# Patient Record
Sex: Female | Born: 1995 | Race: Black or African American | Hispanic: No | Marital: Single
Health system: Midwestern US, Community
[De-identification: ages and names within clinical notes are randomized; demographics above are authoritative.]

## PROBLEM LIST (undated history)

## (undated) DIAGNOSIS — E079 Disorder of thyroid, unspecified: Secondary | ICD-10-CM

---

## 2015-01-16 ENCOUNTER — Other Ambulatory Visit (HOSPITAL_COMMUNITY)
Admission: RE | Admit: 2015-01-16 | Discharge: 2015-01-16 | Disposition: A | Discharge status: Home / Self Care | Payer: Medicaid Other | Source: Ambulatory Visit | Attending: Internal Medicine | Admitting: Internal Medicine

## 2015-01-16 DIAGNOSIS — E031 Congenital hypothyroidism without goiter: Secondary | ICD-10-CM | POA: Diagnosis present

## 2015-01-16 LAB — T4, FREE: FREE T4: 1.79 ng/dL — AB (ref 0.61–1.12)

## 2015-01-16 LAB — TSH: TSH: 0.023 u[IU]/mL — AB (ref 0.350–4.500)

## 2015-06-24 ENCOUNTER — Emergency Department (INDEPENDENT_AMBULATORY_CARE_PROVIDER_SITE_OTHER): Payer: Medicaid Other

## 2015-06-24 ENCOUNTER — Emergency Department (INDEPENDENT_AMBULATORY_CARE_PROVIDER_SITE_OTHER)
Admission: EM | Admit: 2015-06-24 | Discharge: 2015-06-24 | Disposition: A | Discharge status: Home / Self Care | Payer: Medicaid Other | Source: Home / Self Care | Attending: Family Medicine | Admitting: Family Medicine

## 2015-06-24 ENCOUNTER — Encounter (HOSPITAL_COMMUNITY): Payer: Self-pay | Admitting: *Deleted

## 2015-06-24 DIAGNOSIS — S63502A Unspecified sprain of left wrist, initial encounter: Secondary | ICD-10-CM | POA: Diagnosis not present

## 2015-06-24 HISTORY — DX: Disorder of thyroid, unspecified: E07.9

## 2015-06-24 MED ORDER — MELOXICAM 7.5 MG PO TABS: 7.5000 mg | ORAL_TABLET | Freq: Two times a day (BID) | ORAL | Status: DC

## 2015-06-24 NOTE — Discharge Instructions (Signed)
Use ice , splint and meds as needed for soreness, see orthopedist if further problems.

## 2015-06-24 NOTE — ED Notes (Signed)
Pt  Reports   Injured  Her  l  Wrist  Last  Week  When  She  Jammed  Her  l  Arm on 1  Door     She  Has  Pain and  Swelling  l  foreram  Area

## 2015-06-24 NOTE — ED Provider Notes (Signed)
CSN: [id number]     Arrival date & time 06/24/15  1333 History   First MD Initiated Contact with Patient 06/24/15 1434     Chief Complaint  Patient presents with  . Arm Injury   (Consider location/radiation/quality/duration/timing/severity/associated sxs/prior Treatment) Patient is a 19 y.o. female presenting with arm injury. The history is provided by the patient.  Arm Injury Location:  Arm Arm location:  L forearm Pain details:    Quality:  Sharp   Severity:  Mild   Onset quality:  Gradual   Duration:  12 days   Progression:  Unchanged Chronicity:  New Dislocation: no   Foreign body present:  No foreign bodies Prior injury to area:  No Associated symptoms: decreased range of motion   Associated symptoms: no fever, no numbness, no stiffness and no swelling     Past Medical History  Diagnosis Date  . Thyroid disease    History reviewed. No pertinent past surgical history. History reviewed. No pertinent family history. Social History  Substance Use Topics  . Smoking status: None  . Smokeless tobacco: Never Used  . Alcohol Use: No   OB History    No data available     Review of Systems  Constitutional: Negative.  Negative for fever.  Musculoskeletal: Negative.  Negative for joint swelling and stiffness.  Skin: Negative.   All other systems reviewed and are negative.   Allergies  Review of patient's allergies indicates no known allergies.  Home Medications   Prior to Admission medications   Medication Sig Start Date End Date Taking? Authorizing Provider  ALBUTEROL IN Inhale into the lungs.   Yes Historical Provider, MD  Levothyroxine Sodium (LEVOTHROID PO) Take by mouth.   Yes Historical Provider, MD  Loratadine (CLARITIN PO) Take by mouth.   Yes Historical Provider, MD  meloxicam (MOBIC) 7.5 MG tablet Take 1 tablet (7.5 mg total) by mouth 2 (two) times daily after a meal. 06/24/15   Linna Hoff, MD   Meds Ordered and Administered this Visit  Medications -  No data to display  BP 125/73 mmHg  Pulse 62  Temp(Src) 97.9 F (36.6 C) (Oral)  SpO2 98%  LMP 05/29/2015 No data found.   Physical Exam  Constitutional: She is oriented to person, place, and time. She appears well-developed and well-nourished. No distress.  Musculoskeletal: Normal range of motion. She exhibits tenderness.       Left elbow: She exhibits normal range of motion, no swelling, no effusion and no deformity. Tenderness found.       Left wrist: She exhibits tenderness. She exhibits normal range of motion, no bony tenderness, no swelling, no effusion and no deformity.  Neurological: She is alert and oriented to person, place, and time.  Skin: Skin is warm and dry.  Nursing note and vitals reviewed.   ED Course  Procedures (including critical care time)  Labs Review Labs Reviewed - No data to display  Imaging Review Dg Forearm Left  06/24/2015  CLINICAL DATA:  Injury 12 days ago EXAM: LEFT FOREARM - 2 VIEW COMPARISON:  None. FINDINGS: There is no evidence of fracture or other focal bone lesions. Soft tissues are unremarkable. IMPRESSION: Negative. Electronically Signed   By: Jolaine Click M.D.   On: 06/24/2015 14:50   X-rays reviewed and report per radiologist.   Visual Acuity Review  Right Eye Distance:   Left Eye Distance:   Bilateral Distance:    Right Eye Near:   Left Eye Near:  Bilateral Near:         MDM   1. Sprain of wrist, left, initial encounter        Linna HoffJames D Kindl, MD 06/24/15 1544

## 2015-12-29 ENCOUNTER — Ambulatory Visit (HOSPITAL_COMMUNITY)
Admission: EM | Admit: 2015-12-29 | Discharge: 2015-12-29 | Disposition: A | Discharge status: Home / Self Care | Payer: Medicaid Other | Attending: Family Medicine | Admitting: Family Medicine

## 2015-12-29 ENCOUNTER — Encounter (HOSPITAL_COMMUNITY): Payer: Self-pay | Admitting: Emergency Medicine

## 2015-12-29 DIAGNOSIS — M67432 Ganglion, left wrist: Secondary | ICD-10-CM

## 2015-12-29 DIAGNOSIS — L089 Local infection of the skin and subcutaneous tissue, unspecified: Secondary | ICD-10-CM

## 2015-12-29 DIAGNOSIS — L723 Sebaceous cyst: Secondary | ICD-10-CM | POA: Diagnosis not present

## 2015-12-29 MED ORDER — CEPHALEXIN 500 MG PO CAPS: 500.0000 mg | ORAL_CAPSULE | Freq: Two times a day (BID) | ORAL | Status: DC

## 2015-12-29 NOTE — ED Provider Notes (Signed)
CSN: [id number]     Arrival date & time 12/29/15  1302 History   First MD Initiated Contact with Patient 12/29/15 1324     Chief Complaint  Patient presents with  . Abscess  . Wrist Injury   (Consider location/radiation/quality/duration/timing/severity/associated sxs/prior Treatment) HPI Comments: Patient is a 20 yo that presents with 2 issues today. 1. She is having some continued pain in the left wrist after hitting it on a wall 2 days ago. She notes a "knot" in the wrist. No overall joint swelling, erythema or warmth.  2. Recurrent "boils" on her buttocks. No fever or chills. One of these areas is causing some pain. No drainage.   Patient is a 20 y.o. female presenting with abscess and wrist injury. The history is provided by the patient.  Abscess Associated symptoms: no fever   Wrist Injury Associated symptoms: no fever     Past Medical History  Diagnosis Date  . Thyroid disease    History reviewed. No pertinent past surgical history. Family History  Problem Relation Age of Onset  . Cancer Mother    Social History  Substance Use Topics  . Smoking status: Never Smoker   . Smokeless tobacco: Never Used  . Alcohol Use: No   OB History    No data available     Review of Systems  Constitutional: Negative for fever.  Musculoskeletal: Negative for joint swelling and arthralgias.  Skin: Negative for color change and rash.    Allergies  Review of patient's allergies indicates no known allergies.  Home Medications   Prior to Admission medications   Medication Sig Start Date End Date Taking? Authorizing Provider  Levothyroxine Sodium (LEVOTHROID PO) Take by mouth.   Yes Historical Provider, MD  ALBUTEROL IN Inhale into the lungs.    Historical Provider, MD  cephALEXin (KEFLEX) 500 MG capsule Take 1 capsule (500 mg total) by mouth 2 (two) times daily. 12/29/15   Riki Sheer, PA-C  Loratadine (CLARITIN PO) Take by mouth.    Historical Provider, MD  meloxicam (MOBIC)  7.5 MG tablet Take 1 tablet (7.5 mg total) by mouth 2 (two) times daily after a meal. 06/24/15   Linna Hoff, MD   Meds Ordered and Administered this Visit  Medications - No data to display  BP 140/80 mmHg  Pulse 63  Temp(Src) 98.7 F (37.1 C) (Oral)  Resp 12  SpO2 100%  LMP 11/29/2015 (Exact Date) No data found.   Physical Exam  Constitutional: She is oriented to person, place, and time. She appears well-developed and well-nourished. No distress.  Musculoskeletal: Normal range of motion. She exhibits tenderness. She exhibits no edema.  Left wrist without effusion, erythema or warmth. Small probable ganglion cyst that is tender to palpation.   Neurological: She is alert and oriented to person, place, and time.  Skin: Skin is warm and dry. She is not diaphoretic. No erythema.  Small sores to both buttocks totaling 3. No fluctuant areas. Tender to palpation  Psychiatric: Her behavior is normal.  Nursing note and vitals reviewed.   ED Course  Procedures (including critical care time)  Labs Review Labs Reviewed - No data to display  Imaging Review No results found.   Visual Acuity Review  Right Eye Distance:   Left Eye Distance:   Bilateral Distance:    Right Eye Near:   Left Eye Near:    Bilateral Near:         MDM   1. Ganglion cyst of wrist,  left   2. Infected sebaceous cyst    1. Suggest use of brace (she has) for days to a week to rest. Fluid generally will dissipate. If continues to cause irritation f/u with Ortho. No emergent needs. Advil or Aleve may be helpful as well.  2. Non-fluctuant so an I&D is not warranted. Cover with Keflex and suggest use of Hibiclens baths x 2 weeks, avoiding genitalia. If worsens infection then f/u.      Riki SheerMichelle G Young, PA-C 12/29/15 1428

## 2015-12-29 NOTE — ED Notes (Signed)
Pt is following up on a wrist injury she was seen here for a few weeks ago.  She has hit the wrist again and it the bump on it has gotten bigger and the pain has gotten worse.  Pt also reports recurrent abscesses on her left buttocks that open up and drain, but keep coming back.

## 2015-12-29 NOTE — Discharge Instructions (Signed)
1. You have a ganglion cyst in the left wrist. This will get inflammed at times with trauma. Wear your wrist splint to prevent movement for a few days and this will help. Advil or Aleve will help with the inflammation. If it worsens or becomes larger then f/u with [facility] (number given).  2. You have sebaceous cyst that are inflamed and or infected. The antibiotic will help. None that needs opening on exam today. Discussed use of Hibiclens to these areas will help reoccurrence. Nice to meet you. Feel better.    Sebaceous Cyst Removal Sebaceous cyst removal is a procedure to remove a sac of oily material that forms under your skin (sebaceous cyst). Sebaceous cysts may also be called epidermoid cysts or keratin cysts. Normally, the skin secretes this oily material through a gland or a hair follicle. This type of cyst usually results when a skin gland or hair follicle becomes blocked. You may need this procedure if you have a sebaceous cyst that becomes large, uncomfortable, or infected. LET [facility] CARE PROVIDER KNOW ABOUT:  Any allergies you have.  All medicines you are taking, including vitamins, herbs, eye drops, creams, and over-the-counter medicines.  Previous problems you or members of your family have had with the use of anesthetics.  Any blood disorders you have.  Previous surgeries you have had.  Medical conditions you have. RISKS AND COMPLICATIONS Generally, this is a safe procedure. However, problems may occur, including:  Developing another cyst.  Bleeding.  Infection.  Scarring. BEFORE THE PROCEDURE  Ask your health care provider about:  Changing or stopping your regular medicines. This is especially important if you are taking diabetes medicines or blood thinners.  Taking medicines such as aspirin and ibuprofen. These medicines can thin your blood. Do not take these medicines before your procedure if your health care provider instructs you not to.  If you  have an infected cyst, you may have to take antibiotic medicines before or after the cyst removal. Take your antibiotics as directed by your health care provider. Finish all of the medicine even if you start to feel better.  Take a shower on the morning of your procedure. Your health care provider may ask you to use a germ-killing (antiseptic) soap. PROCEDURE  You will be given a medicine that numbs the area (local anesthetic).  The skin around the cyst will be cleaned with a germ-killing solution (antiseptic).  Your health care provider will make a small surgical incision over the cyst.  The cyst will be separated from the surrounding tissues that are under your skin.  If possible, the cyst will be removed undamaged (intact).  If the cyst bursts (ruptures), it will need to be removed in pieces.  After the cyst is removed, your health care provider will control any bleeding and close the incision with small stitches (sutures). Small incisions may not need sutures, and the bleeding will be controlled by applying direct pressure with gauze.  Your health care provider may apply antibiotic ointment and a light bandage (dressing) over the incision. This procedure may vary among health care providers and hospitals. AFTER THE PROCEDURE  If your cyst ruptured during surgery, you may need to take antibiotic medicine. If you were prescribed an antibiotic medicine, finish all of it even if you start to feel better.   This information is not intended to replace advice given to you by your health care provider. Make sure you discuss any questions you have with your health care  provider.   Document Released: 07/05/2000 Document Revised: 07/29/2014 Document Reviewed: 03/23/2014 Elsevier Interactive Patient Education 2016 Else[employer] Inc[employer]  Ganglion Cyst A ganglion cyst is a noncancerous, fluid-filled lump that occurs near joints or tendons. The ganglion cyst grows out of a joint or the lining of a  tendon. It most often develops in the hand or wrist, but it can also develop in the shoulder, elbow, hip, knee, ankle, or foot. The round or oval ganglion cyst can be the size of a pea or larger than a grape. Increased activity may enlarge the size of the cyst because more fluid starts to build up.  CAUSES It is not known what causes a ganglion cyst to grow. However, it may be related to:  Inflammation or irritation around the joint.  An injury.  Repetitive movements or overuse.  Arthritis. RISK FACTORS Risk factors include:  Being a woman.  Being age 20-50. SIGNS AND SYMPTOMS Symptoms may include:   A lump. This most often appears on the hand or wrist, but it can occur in other areas of the body.  Tingling.  Pain.  Numbness.  Muscle weakness.  Weak grip.  Less movement in a joint. DIAGNOSIS Ganglion cysts are most often diagnosed based on a physical exam. Your health care provider will feel the lump and may shine a light alongside it. If it is a ganglion cyst, a light often shines through it. Your health care provider may order an X-ray, ultrasound, or MRI to rule out other conditions. TREATMENT Ganglion cysts usually go away on their own without treatment. If pain or other symptoms are involved, treatment may be needed. Treatment is also needed if the ganglion cyst limits your movement or if it gets infected. Treatment may include:  Wearing a brace or splint on your wrist or finger.  Taking anti-inflammatory medicine.  Draining fluid from the lump with a needle (aspiration).  Injecting a steroid into the joint.  Surgery to remove the ganglion cyst. HOME CARE INSTRUCTIONS  Do not press on the ganglion cyst, poke it with a needle, or hit it.  Take medicines only as directed by your health care provider.  Wear your brace or splint as directed by your health care provider.  Watch your ganglion cyst for any changes.  Keep all follow-up visits as directed by your  health care provider. This is important. SEEK MEDICAL CARE IF:  Your ganglion cyst becomes larger or more painful.  You have increased redness, red streaks, or swelling.  You have pus coming from the lump.  You have weakness or numbness in the affected area.  You have a fever or chills.   This information is not intended to replace advice given to you by your health care provider. Make sure you discuss any questions you have with your health care provider.   Document Released: 07/05/2000 Document Revised: 07/29/2014 Document Reviewed: 12/21/2013 Elsevier Interactive Patient Education [employer] Elsevier Inc.

## 2016-05-14 IMAGING — DX DG FOREARM 2V*L*
2 series · 2 of 2 positions shown · non-contrast
Comparison: None.

CLINICAL DATA: Injury 12 days ago

EXAM:
LEFT FOREARM - 2 VIEW

[forearm ap]
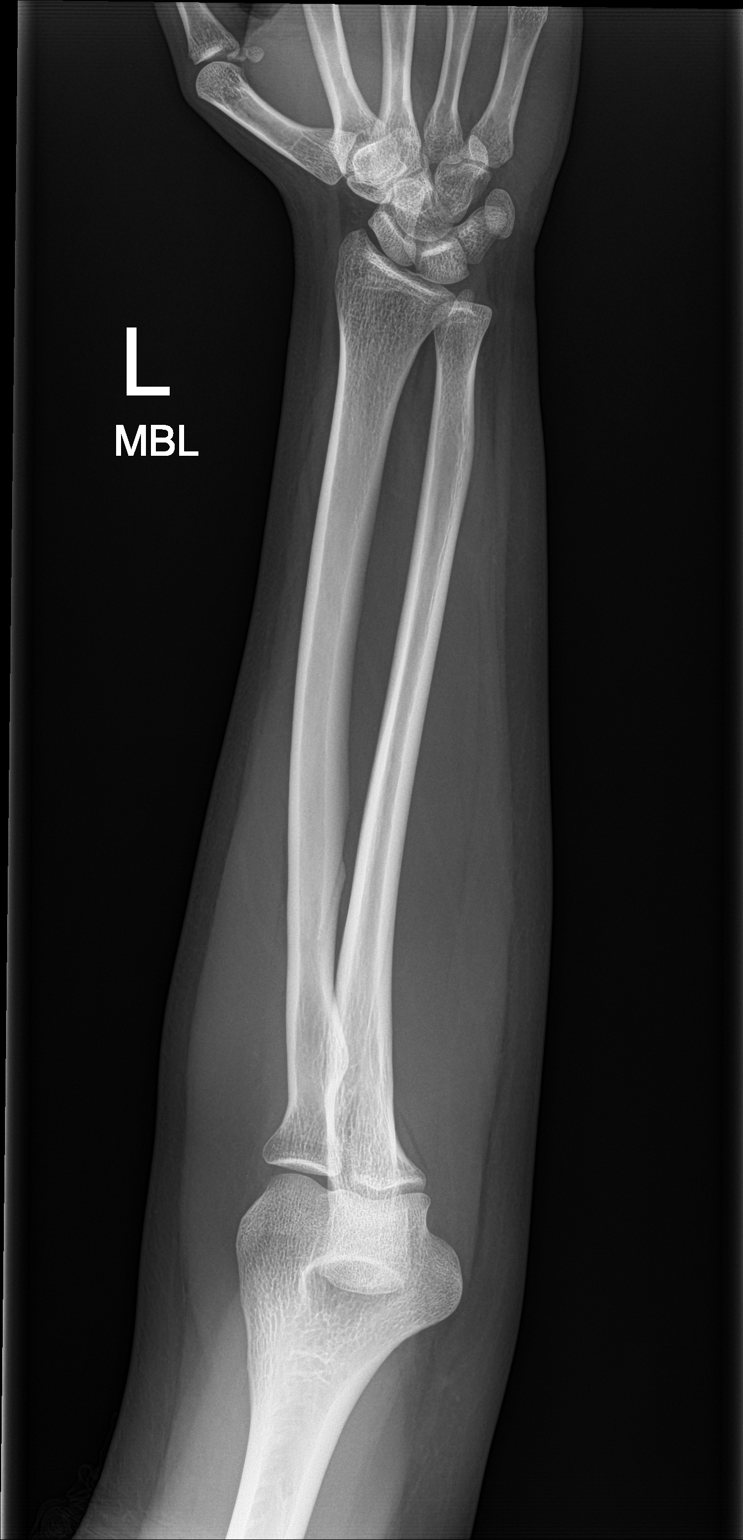

[forearm lat]
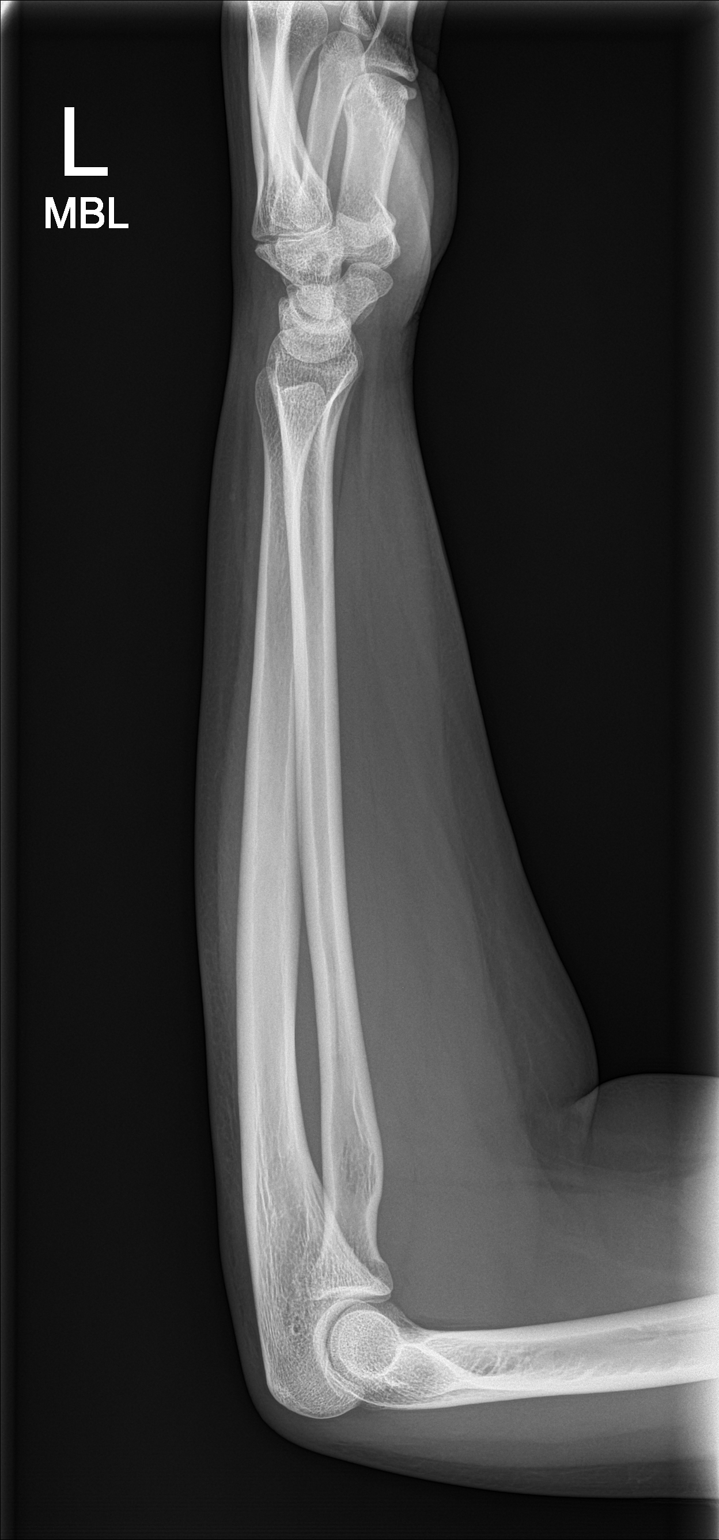

[2 of 2 positions shown; findings below may reference images not displayed]

FINDINGS: There is no evidence of fracture or other focal bone lesions. Soft
tissues are unremarkable.
IMPRESSION: Negative.

## 2016-10-21 ENCOUNTER — Other Ambulatory Visit (HOSPITAL_COMMUNITY): Payer: Self-pay | Admitting: Family Medicine

## 2016-10-21 DIAGNOSIS — E000 Congenital iodine-deficiency syndrome, neurological type: Secondary | ICD-10-CM

## 2016-11-04 ENCOUNTER — Ambulatory Visit (HOSPITAL_COMMUNITY): Payer: Medicaid Other

## 2016-11-05 ENCOUNTER — Encounter (HOSPITAL_COMMUNITY): Payer: Medicaid Other

## 2016-11-09 ENCOUNTER — Encounter (HOSPITAL_COMMUNITY): Payer: Self-pay | Admitting: Emergency Medicine

## 2016-11-09 ENCOUNTER — Ambulatory Visit (HOSPITAL_COMMUNITY)
Admission: EM | Admit: 2016-11-09 | Discharge: 2016-11-09 | Disposition: A | Discharge status: Home / Self Care | Payer: Medicaid Other | Attending: Family Medicine | Admitting: Family Medicine

## 2016-11-09 DIAGNOSIS — R51 Headache: Secondary | ICD-10-CM

## 2016-11-09 DIAGNOSIS — M5489 Other dorsalgia: Secondary | ICD-10-CM

## 2016-11-09 DIAGNOSIS — M542 Cervicalgia: Secondary | ICD-10-CM | POA: Diagnosis not present

## 2016-11-09 MED ORDER — CYCLOBENZAPRINE HCL 10 MG PO TABS: 10.0000 mg | ORAL_TABLET | Freq: Two times a day (BID) | ORAL | 0 refills | Status: AC | PRN

## 2016-11-09 MED ORDER — IBUPROFEN 800 MG PO TABS: 800.0000 mg | ORAL_TABLET | Freq: Three times a day (TID) | ORAL | 0 refills | Status: AC | PRN

## 2016-11-09 NOTE — Discharge Instructions (Signed)
Ibuprofen and Muscle relaxer send in to your pharmacy. Take them as needed. Apply heat to the area. Massage the area.  Return as needed. Follow up with your primary care doctor for no improvement.
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
## 2016-11-12 ENCOUNTER — Encounter (HOSPITAL_COMMUNITY)
Admission: RE | Admit: 2016-11-12 | Discharge: 2016-11-12 | Disposition: A | Discharge status: Home / Self Care | Payer: Medicaid Other | Source: Ambulatory Visit | Attending: Family Medicine | Admitting: Family Medicine

## 2016-11-12 ENCOUNTER — Encounter (HOSPITAL_COMMUNITY): Payer: Self-pay

## 2016-11-12 DIAGNOSIS — E000 Congenital iodine-deficiency syndrome, neurological type: Secondary | ICD-10-CM

## 2016-11-13 ENCOUNTER — Encounter (HOSPITAL_COMMUNITY): Payer: Medicaid Other

## 2016-12-05 ENCOUNTER — Encounter (HOSPITAL_COMMUNITY): Payer: Self-pay | Admitting: *Deleted

## 2016-12-05 ENCOUNTER — Ambulatory Visit (HOSPITAL_COMMUNITY)
Admission: EM | Admit: 2016-12-05 | Discharge: 2016-12-05 | Disposition: A | Discharge status: Home / Self Care | Payer: Medicaid Other | Attending: Internal Medicine | Admitting: Internal Medicine

## 2016-12-05 DIAGNOSIS — R350 Frequency of micturition: Secondary | ICD-10-CM | POA: Insufficient documentation

## 2016-12-05 DIAGNOSIS — R300 Dysuria: Secondary | ICD-10-CM

## 2016-12-05 DIAGNOSIS — Z79899 Other long term (current) drug therapy: Secondary | ICD-10-CM | POA: Diagnosis not present

## 2016-12-05 DIAGNOSIS — E079 Disorder of thyroid, unspecified: Secondary | ICD-10-CM | POA: Insufficient documentation

## 2016-12-05 DIAGNOSIS — Z3202 Encounter for pregnancy test, result negative: Secondary | ICD-10-CM | POA: Diagnosis not present

## 2016-12-05 DIAGNOSIS — S5011XA Contusion of right forearm, initial encounter: Secondary | ICD-10-CM

## 2016-12-05 DIAGNOSIS — K5900 Constipation, unspecified: Secondary | ICD-10-CM | POA: Diagnosis not present

## 2016-12-05 LAB — POCT URINALYSIS DIP (DEVICE)
Bilirubin Urine: NEGATIVE
Glucose, UA: NEGATIVE mg/dL
HGB URINE DIPSTICK: NEGATIVE
Ketones, ur: NEGATIVE mg/dL
NITRITE: NEGATIVE
Protein, ur: 30 mg/dL — AB
Specific Gravity, Urine: 1.02 (ref 1.005–1.030)
Urobilinogen, UA: 1 mg/dL (ref 0.0–1.0)
pH: 7.5 (ref 5.0–8.0)

## 2016-12-05 LAB — POCT PREGNANCY, URINE: PREG TEST UR: NEGATIVE

## 2016-12-05 MED ORDER — AZITHROMYCIN 250 MG PO TABS
1000.0000 mg | ORAL_TABLET | Freq: Once | ORAL | Status: AC
  Administered 2016-12-05: 1000 mg via ORAL

## 2016-12-05 MED ORDER — CEFTRIAXONE SODIUM 250 MG IJ SOLR
INTRAMUSCULAR | Status: AC
  Filled 2016-12-05: qty 250

## 2016-12-05 MED ORDER — AZITHROMYCIN 250 MG PO TABS
ORAL_TABLET | ORAL | Status: AC
  Filled 2016-12-05: qty 4

## 2016-12-05 MED ORDER — CEFTRIAXONE SODIUM 250 MG IJ SOLR
250.0000 mg | Freq: Once | INTRAMUSCULAR | Status: AC
  Administered 2016-12-05: 250 mg via INTRAMUSCULAR

## 2016-12-05 MED ORDER — POLYETHYLENE GLYCOL 3350 17 GM/SCOOP PO POWD: 17.0000 g | Freq: Every day | ORAL | 0 refills | Status: DC

## 2016-12-05 MED ORDER — STERILE WATER FOR INJECTION IJ SOLN
INTRAMUSCULAR | Status: AC
  Filled 2016-12-05: qty 10

## 2016-12-05 NOTE — ED Provider Notes (Signed)
[facility]    CSN: [id number] Arrival date & time: 12/05/16  1248     History   Chief Complaint Chief Complaint  Patient presents with  . Urinary Tract Infection    HPI Danielle Rosales is a 21 y.o. female. She presents today with several days history of urinary frequency and small volumes, dysuria. No pelvic discomfort. This had some decreased frequency of bowel movements, smaller size. No unusual vaginal discharge. Had her menstrual cycle last week. Has a little bit of itching on her bottom when she has a bowel movement, wondered about a hemorrhoid. Not painful. No fever, no malaise. Was wrestling with her companion last night, and struck her right ulnar forearm on something, no sore. Able to move the wrist and the elbow without difficulty.    HPI  Past Medical History:  Diagnosis Date  . Thyroid disease    History reviewed. No pertinent surgical history.   Home Medications    Prior to Admission medications   Medication Sig Start Date End Date Taking? Authorizing Provider  ALBUTEROL IN Inhale into the lungs.    [provider]  cephALEXin (KEFLEX) 500 MG capsule Take 1 capsule (500 mg total) by mouth 2 (two) times daily. 12/07/16 12/12/16  Eustace MooreMurray, Makynna Manocchio W, MD  Levothyroxine Sodium (LEVOTHROID PO) Take by mouth.    [provider]  Loratadine (CLARITIN PO) Take by mouth.    [provider]  meloxicam (MOBIC) 7.5 MG tablet Take 1 tablet (7.5 mg total) by mouth 2 (two) times daily after a meal. 06/24/15   Kindl, Quita SkyeJames D, MD  polyethylene glycol powder (GLYCOLAX/MIRALAX) powder Take 17 g by mouth daily. 12/05/16   Eustace MooreMurray, Millisa Giarrusso W, MD  temazepam (RESTORIL) 7.5 MG capsule Take 7.5 mg by mouth at bedtime as needed for sleep.    [provider]  venlafaxine (EFFEXOR) 75 MG tablet Take 75 mg by mouth 2 (two) times daily.    [provider]    Family History Family History  Problem Relation Age of Onset  . Cancer Mother      Social History Social History  Substance Use Topics  . Smoking status: Never Smoker  . Smokeless tobacco: Never Used  . Alcohol use No     Allergies   Patient has no known allergies.   Review of Systems Review of Systems  All other systems reviewed and are negative.    Physical Exam Triage Vital Signs ED Triage Vitals  Enc Vitals Group     BP 12/05/16 1305 (!) 110/59     Pulse Rate 12/05/16 1305 78     Resp 12/05/16 1305 18     Temp 12/05/16 1305 98.5 F (36.9 C)     Temp Source 12/05/16 1305 Oral     SpO2 12/05/16 1305 100 %     Weight --      Height --      Pain Score 12/05/16 1303 7     Pain Loc --    Updated Vital Signs BP (!) 110/59 (BP Location: Right Arm)   Pulse 78   Temp 98.5 F (36.9 C) (Oral)   Resp 18   LMP 11/29/2016   SpO2 100%   Physical Exam  Constitutional: She is oriented to person, place, and time. No distress.  HENT:  Head: Atraumatic.  Eyes:  Conjugate gaze observed, no eye redness/discharge  Neck: Neck supple.  Cardiovascular: Normal rate.   Pulmonary/Chest: No respiratory distress.  Abdominal: Soft. She exhibits no distension.  There is no tenderness. There is no rebound and no guarding.  Genitourinary:  Genitourinary Comments: No perineal rash Tiny pile at 6:00 position of anus, noninflamed, non tender  Musculoskeletal: Normal range of motion.  Good wrist and elbow range of motion on the right, focal area of mild tenderness and slight swelling, bruising, just distal to the midpoint of the ulnar forearm.  Neurological: She is alert and oriented to person, place, and time.  Skin: Skin is warm and dry.  Nursing note and vitals reviewed.    UC Treatments / Results  Labs Results for orders placed or performed during the hospital encounter of 12/05/16  Urine culture  Result Value Ref Range   Specimen Description URINE, CLEAN CATCH    Special Requests NONE    Culture >=100,000 COLONIES/mL VIRIDANS STREPTOCOCCUS (A)     Report Status 12/07/2016 FINAL   POCT urinalysis dip (device)  Result Value Ref Range   Glucose, UA NEGATIVE NEGATIVE mg/dL   Bilirubin Urine NEGATIVE NEGATIVE   Ketones, ur NEGATIVE NEGATIVE mg/dL   Specific Gravity, Urine 1.020 1.005 - 1.030   Hgb urine dipstick NEGATIVE NEGATIVE   pH 7.5 5.0 - 8.0   Protein, ur 30 (A) NEGATIVE mg/dL   Urobilinogen, UA 1.0 0.0 - 1.0 mg/dL   Nitrite NEGATIVE NEGATIVE   Leukocytes, UA SMALL (A) NEGATIVE  Pregnancy, urine POC  Result Value Ref Range   Preg Test, Ur NEGATIVE NEGATIVE  Urine cytology ancillary only  Result Value Ref Range   Chlamydia Negative    Neisseria gonorrhea Negative    Trichomonas **POSITIVE** (A)     Procedures Procedures (including critical care time)  Medications Ordered in UC Medications  cefTRIAXone (ROCEPHIN) injection 250 mg (250 mg Intramuscular Given 12/05/16 1459)  azithromycin (ZITHROMAX) tablet 1,000 mg (1,000 mg Oral Given 12/05/16 1458)     Final Clinical Impressions(s) / UC Diagnoses   Final diagnoses:  Constipation, unspecified constipation type  Contusion of right forearm, initial encounter  Urinary frequency   Urine test did not clearly suggest a UTI today; a urine culture and other tests are pending to help determine cause of urinary symptoms.  Injection of rocephin and oral dose of zithromax given today.  The urgent care will contact you if further treatment is needed.  For symptoms of constipation, would try miralax (polyethylene glycol) powder, 1 spoon in juice 1-2 times daily until soft daily stool occurs, to see if this helps with urinary symptoms also.  Recheck or followup with primary care provider as needed if symptoms are not improving.  Bruise on right forearm will take a couple weeks to heal, may be sore until then.  Ice and advil will help.   New Prescriptions Discharge Medication List as of 12/05/2016  2:43 PM    START taking these medications   Details  polyethylene glycol powder  (GLYCOLAX/MIRALAX) powder Take 17 g by mouth daily., Starting Thu 12/05/2016, Normal         Eustace Moore, MD 12/08/16 [phone]

## 2016-12-05 NOTE — ED Triage Notes (Signed)
Pt  Reports   Frequent  And  burining  Urination  For  About  1   Week    Pt  Thinks   Itching  And  Poss  hemmoriod   Pain  r  Arm       Since  Yesterday  After  Playing  With  Her  Child

## 2016-12-05 NOTE — Discharge Instructions (Addendum)
Urine test did not clearly suggest a UTI today; a urine culture and other tests are pending to help determine cause of urinary symptoms.  Injection of rocephin and oral dose of zithromax given today.  The urgent care will contact you if further treatment is needed.  For symptoms of constipation, would try miralax (polyethylene glycol) powder, 1 spoon in juice 1-2 times daily until soft daily stool occurs, to see if this helps with urinary symptoms also.  Recheck or followup with primary care provider as needed if symptoms are not improving.  Bruise on right forearm will take a couple weeks to heal, may be sore until then.  Ice and advil will help.

## 2016-12-06 LAB — URINE CYTOLOGY ANCILLARY ONLY
Chlamydia: NEGATIVE
NEISSERIA GONORRHEA: NEGATIVE
Trichomonas: POSITIVE — AB

## 2016-12-07 ENCOUNTER — Telehealth (HOSPITAL_COMMUNITY): Payer: Self-pay | Admitting: Internal Medicine

## 2016-12-07 LAB — URINE CULTURE: Culture: >=100000 — AB

## 2016-12-07 MED ORDER — CEPHALEXIN 500 MG PO CAPS: 500.0000 mg | ORAL_CAPSULE | Freq: Two times a day (BID) | ORAL | 0 refills | Status: AC

## 2016-12-07 MED ORDER — METRONIDAZOLE 500 MG PO TABS: 2000.0000 mg | ORAL_TABLET | Freq: Once | ORAL | 0 refills | Status: AC

## 2016-12-07 NOTE — Telephone Encounter (Signed)
Please let patient know that urine culture was positive for Strep germ.  Rx cephalexin sent to pharmacy of record, [facility] [place].  Test for trichomonas was also positive; rx metronidazole also sent to the pharmacy.  Recheck or followup with PCP for further evaluation if symptoms are not improving.  LM

## 2017-11-03 ENCOUNTER — Ambulatory Visit (HOSPITAL_COMMUNITY)
Admission: EM | Admit: 2017-11-03 | Discharge: 2017-11-03 | Disposition: A | Discharge status: Home / Self Care | Payer: Managed Care, Other (non HMO) | Attending: Internal Medicine | Admitting: Internal Medicine

## 2017-11-03 ENCOUNTER — Encounter (HOSPITAL_COMMUNITY): Payer: Self-pay | Admitting: Family Medicine

## 2017-11-03 DIAGNOSIS — R062 Wheezing: Secondary | ICD-10-CM

## 2017-11-03 DIAGNOSIS — J040 Acute laryngitis: Secondary | ICD-10-CM

## 2017-11-03 DIAGNOSIS — K5900 Constipation, unspecified: Secondary | ICD-10-CM

## 2017-11-03 DIAGNOSIS — M79672 Pain in left foot: Secondary | ICD-10-CM | POA: Diagnosis not present

## 2017-11-03 MED ORDER — IPRATROPIUM-ALBUTEROL 0.5-2.5 (3) MG/3ML IN SOLN
RESPIRATORY_TRACT | Status: AC
  Filled 2017-11-03: qty 3

## 2017-11-03 MED ORDER — IPRATROPIUM-ALBUTEROL 0.5-2.5 (3) MG/3ML IN SOLN
3.0000 mL | Freq: Once | RESPIRATORY_TRACT | Status: AC
  Administered 2017-11-03: 3 mL via RESPIRATORY_TRACT

## 2017-11-03 MED ORDER — NAPROXEN 375 MG PO TABS: 375.0000 mg | ORAL_TABLET | Freq: Two times a day (BID) | ORAL | 0 refills | Status: AC

## 2017-11-03 NOTE — ED Triage Notes (Signed)
Pt here for coughing, wheezing, laryngitis. Also right foot pain and constipation.

## 2017-11-03 NOTE — ED Provider Notes (Addendum)
[facility]    CSN: [id number] Arrival date & time: 11/03/17  1843     History   Chief Complaint Chief Complaint  Patient presents with  . Foot Pain  . Wheezing    HPI Danielle Rosales is a 22 y.o. female who complains of wheezing for the past 2-3 days.  Patient has a history of asthma and states it has been constant.  Patient has been using albuterol with temporary relief.  Patient also complains of dry cough for the same length of time.  She has tried tylenol without relief.  Her symptoms are made worse with cold weather.  She reports similar symptoms in the past that improved with nebulizer treatment.    Patient also complains of left foot pain.  She has had the pain since Thursday.  Patient states she has "torn ligaments" in her left ankle and states she was wearing heels which exacerbated her symptoms.  She localizes her pain to the inside of her foot.  She states the pain is constant.  She has tried home exercise and conservative treatment without much improvement. Worse with activity.    She also reports a chronic history of constipation.  Her last BM was today and it was hard balls.  She denies alleviating or aggravating symptoms.    HPI  Past Medical History:  Diagnosis Date  . Thyroid disease     There are no active problems to display for this patient.   History reviewed. No pertinent surgical history.  OB History   None      Home Medications    Prior to Admission medications   Medication Sig Start Date End Date Taking? Authorizing Provider  amphetamine-dextroamphetamine (ADDERALL) 15 MG tablet Take 15 mg by mouth daily.   Yes [provider]  ALBUTEROL IN Inhale into the lungs.    [provider]  Levothyroxine Sodium (LEVOTHROID PO) Take by mouth.    [provider]  Loratadine (CLARITIN PO) Take by mouth.    [provider]  meloxicam (MOBIC) 7.5 MG tablet Take 1 tablet (7.5 mg total) by mouth 2 (two) times  daily after a meal. 06/24/15   Kindl, Quita Skye, MD  polyethylene glycol powder (GLYCOLAX/MIRALAX) powder Take 17 g by mouth daily. 12/05/16   Isa Rankin, MD  temazepam (RESTORIL) 7.5 MG capsule Take 7.5 mg by mouth at bedtime as needed for sleep.    [provider]  venlafaxine (EFFEXOR) 75 MG tablet Take 75 mg by mouth 2 (two) times daily.    [provider]    Family History Family History  Problem Relation Age of Onset  . Cancer Mother     Social History Social History   Tobacco Use  . Smoking status: Never Smoker  . Smokeless tobacco: Never Used  Substance Use Topics  . Alcohol use: No  . Drug use: No     Allergies   Patient has no known allergies.   Review of Systems Review of Systems  Constitutional: Positive for chills and fatigue. Negative for fever.  HENT: Negative for congestion, ear pain, rhinorrhea, sinus pressure, sinus pain, sneezing and sore throat.   Eyes: Negative for discharge.  Respiratory: Positive for cough, chest tightness, shortness of breath and wheezing.   Cardiovascular: Negative for chest pain.  Gastrointestinal: Positive for constipation. Negative for abdominal pain, nausea and vomiting.  Genitourinary: Negative for difficulty urinating and dysuria.     Physical Exam Triage Vital Signs ED Triage Vitals  Enc  Vitals Group     BP 11/03/17 1932 113/66     Pulse Rate 11/03/17 1932 64     Resp 11/03/17 1932 18     Temp 11/03/17 1932 97.9 F (36.6 C)     Temp Source 11/03/17 1932 Oral     SpO2 11/03/17 1932 98 %     Weight --      Height --      Head Circumference --      Peak Flow --      Pain Score 11/03/17 1929 5     Pain Loc --      Pain Edu? --      Excl. in GC? --    No data found.  Updated Vital Signs BP 113/66   Pulse 64   Temp 97.9 F (36.6 C) (Oral)   Resp 18   LMP 11/03/2017   SpO2 98%   Visual Acuity Right Eye Distance:   Left Eye Distance:   Bilateral Distance:    Right Eye Near:     Left Eye Near:    Bilateral Near:     Physical Exam  Constitutional: She is oriented to person, place, and time. She appears well-developed and well-nourished.  HENT:  Head: Normocephalic and atraumatic.  Right Ear: External ear normal.  Left Ear: External ear normal.  Nose: Nose normal.  Mouth/Throat: Oropharynx is clear and moist. No oropharyngeal exudate.  Eyes: Pupils are equal, round, and reactive to light. EOM are normal. Right eye exhibits no discharge. Left eye exhibits no discharge. No scleral icterus.  Neck: Normal range of motion. Neck supple.  Cardiovascular: Normal rate and regular rhythm.  No murmur heard. Pulmonary/Chest: Effort normal and breath sounds normal. She has no wheezes. She has no rales.  Lungs CTA bilaterally  Abdominal: Soft. Bowel sounds are normal. She exhibits no distension. There is no tenderness. There is no guarding.  Musculoskeletal: Normal range of motion. She exhibits tenderness. She exhibits no edema or deformity.       Right foot: There is normal range of motion and no deformity.       Left foot: There is tenderness (Diffuse tenderness to palpation about the medial aspect of the foot). There is normal range of motion, no swelling and no deformity.  Right foot nontender to palpation.  Nontender about the medial and lateral malleoli.  Patient ambulates without difficulty.    Feet:  Left Foot:  Skin Integrity: Negative for ulcer.  Lymphadenopathy:    She has no cervical adenopathy.  Neurological: She is alert and oriented to person, place, and time.  Skin: Skin is warm and dry.  Psychiatric: She has a normal mood and affect. Her behavior is normal. Judgment and thought content normal.     UC Treatments / Results  Labs (all labs ordered are listed, but only abnormal results are displayed) Labs Reviewed - No data to display  EKG None Radiology No results found.  Procedures Procedures (including critical care time)  Medications Ordered  in UC Medications - No data to display   Initial Impression / Assessment and Plan / UC Course  I have reviewed the triage vital signs and the nursing notes.  Pertinent labs & imaging results that were available during my care of the patient were reviewed by me and considered in my medical decision making (see chart for details).     Patient complains of wheezing.  Physical exam unremarkable.  Patient reports similar symptoms in the past that improved with  breathing treatment in office.  Breathing treatment given in office.  Symptoms improved.    Patient also complains of left foot pain that began four days ago.  Patient had a prior injury to same ankle/ foot.  Nontender to the medial and lateral malleoli.  Patient able to ambulate without difficulty.  Patient offered crutches and x-ray, but patient refuses at this time.   Hx of chronic constipation.  Last BM today.  Hard and round pellets.  Talked about the importance of incorporating fiber rich foods in her diet and increasing her water intake.  Recommend OTC miralax also.       Final Clinical Impressions(s) / UC Diagnoses   Final diagnoses:  None    ED Discharge Orders    None       Controlled Substance Prescriptions [place] Controlled Substance Registry consulted? No   Rennis Harding, PA-C 11/03/17 2023    Rennis Harding, PA-C 11/03/17 2037    Rennis Harding, PA-C 11/05/17 1258

## 2017-11-03 NOTE — Discharge Instructions (Signed)
Duoneb treatment given in office Continue at home medication as directed  Take naprosyn as directed for foot pain Rest, ice and elevate foot  Use OTC miralax, increased water intake, and incorporate fiber rich foods in diet  Follow up with PCP if symptoms persists Return sooner or go to ER if you have any new or worsening symptoms

## 2018-01-27 ENCOUNTER — Inpatient Hospital Stay
Admit: 2018-01-27 | Discharge: 2018-01-28 | Disposition: A | Discharge status: Home / Self Care | Payer: PRIVATE HEALTH INSURANCE | Attending: Emergency Medicine

## 2018-01-27 DIAGNOSIS — E039 Hypothyroidism, unspecified: Secondary | ICD-10-CM

## 2018-01-27 NOTE — Progress Notes (Signed)
Keflex Rx.

## 2018-01-27 NOTE — ED Provider Notes (Signed)
EMERGENCY DEPARTMENT HISTORY AND PHYSICAL EXAM      Date: 01/27/2018  Patient Name: Rachel Mcfarland    History of Presenting Illness     Chief Complaint   Patient presents with   ??? Medication Refill     out of levothyroxine; reports sore throat and pain with swallowing; out x 1 month; new to area       History Provided By: Patient    HPI: Rachel Mcfarland, 22 y.o. female with PMHx significant for hypothyroidism who presents ambulatory to the ED with cc of sore throat and being out of thyroid medication.  Patient states she is here for the summer from [place], and has run out of her Synthroid 125 mcg daily.  She states she has been out for 1 month.  She states that her endocrinologist is in [place] will not refill her meds here in [place], but she is returning there later this month.    She also complains of sore throat for 3 or 4 days, and a swollen tender area at the top of her right neck under her jaw.  She does not have any fever.  She has mild headache.  The pain is aggravated by touching the swollen area, swallowing, and moving her neck.  She rates the pain a 7 of 10.  She also complains of urinary frequency and burning and states she has frequent UTIs.    PMHx: Hypothyroid, asthma, borderline diabetes, major depressive disorder  PSHx: None growth removed   Social Hx: Occasional EtOH; yes smoker; no illicit Drugs    PCP: None    There are no other complaints, changes, or physical findings at this time.    Current Outpatient Medications   Medication Sig Dispense Refill   ??? levothyroxine (SYNTHROID) 125 mcg tablet Take 1 Tab by mouth Daily (before breakfast). 30 Tab 0   ??? cephALEXin (KEFLEX) 500 mg capsule Take 1 Cap by mouth three (3) times daily for 10 days. 30 Cap 0   ??? fluconazole (DIFLUCAN) 150 mg tablet Take 1 Tab by mouth daily for 1 day. FDA advises cautious prescribing of oral fluconazole in pregnancy. 1 Tab 0     Past History     Past Medical History:   History reviewed. No pertinent past medical history.  Past Surgical History:  History reviewed. No pertinent surgical history.  Family History:  History reviewed. No pertinent family history.  Social History:  Social History     Tobacco Use   ??? Smoking status: Current Every Day Smoker   ??? Smokeless tobacco: Never Used   Substance Use Topics   ??? Alcohol use: Yes   ??? Drug use: Not Currently     Allergies:  No Known Allergies  Review of Systems   Review of Systems   Constitutional: Negative for chills, fatigue and fever.   HENT: Positive for sore throat.    Eyes: Negative for photophobia and visual disturbance.   Respiratory: Negative for shortness of breath.    Cardiovascular: Negative for chest pain.   All other systems reviewed and are negative.    Physical Exam   Physical Exam   Constitutional: She is oriented to person, place, and time. She appears well-developed and well-nourished. No distress.   HENT:   Head: Normocephalic and atraumatic.   Mild oropharyngeal erythema, no exudate   Eyes: Conjunctivae are normal.   Neck: Normal range of motion. Neck supple. No JVD present. No tracheal deviation present. No thyromegaly present.   Cardiovascular:  Normal rate, regular rhythm, normal heart sounds and intact distal pulses.   Pulmonary/Chest: Effort normal and breath sounds normal. No stridor. No respiratory distress. She has no wheezes. She has no rales.   Lymphadenopathy:     She has cervical adenopathy (Tender lymphadenopathy, right anterior cervical).   Neurological: She is alert and oriented to person, place, and time.   Skin: Skin is warm. She is not diaphoretic.   Psychiatric: She has a normal mood and affect.   Nursing note and vitals reviewed.    Diagnostic Study Results   Labs -     Recent Results (from the past 12 hour(s))   STREP AG SCREEN, GROUP A    Collection Time: 01/27/18  8:33 PM   Result Value Ref Range    Group A Strep Ag ID NEGATIVE  NEG     URINALYSIS W/ REFLEX CULTURE     Collection Time: 01/27/18  8:33 PM   Result Value Ref Range    Color YELLOW/STRAW      Appearance CLOUDY (A) CLEAR      Specific gravity 1.015 1.003 - 1.030      pH (UA) 7.5 5.0 - 8.0      Protein NEGATIVE  NEG mg/dL    Glucose NEGATIVE  NEG mg/dL    Ketone TRACE (A) NEG mg/dL    Bilirubin NEGATIVE  NEG      Blood NEGATIVE  NEG      Urobilinogen 1.0 0.2 - 1.0 EU/dL    Nitrites POSITIVE (A) NEG      Leukocyte Esterase MODERATE (A) NEG      WBC 10-20 0 - 4 /hpf    RBC 0-5 0 - 5 /hpf    Epithelial cells MANY (A) FEW /lpf    Bacteria 4+ (A) NEG /hpf    UA:UC IF INDICATED URINE CULTURE ORDERED (A) CNI      Yeast w/hyphae PRESENT (A) NEG     HCG URINE, QL. - POC    Collection Time: 01/27/18  8:35 PM   Result Value Ref Range    Pregnancy test,urine (POC) NEGATIVE  NEG         Radiologic Studies -   No orders to display     No results found.  Medical Decision Making   I am the first provider for this patient.    I reviewed the vital signs, available nursing notes, past medical history, past surgical history, family history and social history.    Vital Signs-Reviewed the patient's vital signs.  Patient Vitals for the past 12 hrs:   Temp Pulse Resp BP   01/27/18 1929 98.4 ??F (36.9 ??C) 91 18 136/88         Records Reviewed: Nursing Notes    Provider Notes (Medical Decision Making):   Strep pharyngitis, lymphadenitis, viral pharyngitis, UTI, pregnancy, vaginitis    ED Course:   Initial assessment performed. The patients presenting problems have been discussed, and they are in agreement with the care plan formulated and outlined with them.  I have encouraged them to ask questions as they arise throughout their visit.         Progress Note:     Updated pt on all returned results and findings. Discussed the importance of proper follow up as referred below along with return precautions. Pt in agreement with the care plan and expresses agreement with and understanding of all items discussed.    Disposition:  Discharge    PLAN:   1.  Current Discharge Medication List      START taking these medications    Details   levothyroxine (SYNTHROID) 125 mcg tablet Take 1 Tab by mouth Daily (before breakfast).  Qty: 30 Tab, Refills: 0      cephALEXin (KEFLEX) 500 mg capsule Take 1 Cap by mouth three (3) times daily for 10 days.  Qty: 30 Cap, Refills: 0      fluconazole (DIFLUCAN) 150 mg tablet Take 1 Tab by mouth daily for 1 day. FDA advises cautious prescribing of oral fluconazole in pregnancy.  Qty: 1 Tab, Refills: 0           2.   Follow-up Information     Follow up With Specialties Details Why Contact Info    None  In 1 month with your PCP/endocrinologist in [place] None (395) Patient stated that they have no PCP      [facility] EMERGENCY DEPT Emergency Medicine  As needed, If symptoms worsen [address] [place] [address]  [phone]        Return to ED if worse     Diagnosis     Clinical Impression:   1. Hypothyroidism, unspecified type    2. Lymphadenitis    3. Acute cystitis without hematuria    4. Vaginal candida

## 2018-01-27 NOTE — ED Notes (Signed)
Patient presents to the ED with c/o a medication refill. Pt reports being out of levothyroxine for one month. Pt reports a sore throat when swallowing x1 week.     Pt is alert and oriented. Pt skin is warm and dry. Pt is ambulatory independently. Pt throat is sore with palpation.     Emergency Department Nursing Plan of Care       The Nursing Plan of Care is developed from the Nursing assessment and Emergency Department Attending provider initial evaluation.  The plan of care may be reviewed in the ???ED Provider note???.    The Plan of Care was developed with the following considerations:   Patient / Family readiness to learn indicated [id number]:verbalized understanding  Persons(s) to be included in education: patient  Barriers to Learning/Limitations:No    Signed     Arm[employer] MeliaKaitlin M Schult    01/27/2018   8:21 PM

## 2018-01-27 NOTE — ED Notes (Signed)
Patient  given copy of dc instructions and 0 paper script(s) and 3 electronic scripts.  Patient  verbalized understanding of instructions and script (s).  Patient given a current medication reconciliation form and verbalized understanding of their medications.   Patient  verbalized understanding of the importance of discussing medications with  his or her physician or clinic they will be following up with.  Patient alert and oriented and in no acute distress.  Patient offered wheelchair from treatment area to hospital entrance, patient declined wheelchair.

## 2018-01-27 NOTE — ED Provider Notes (Signed)
ED Provider Notes by Dearies Meikle, Rhea Pink, MD at 01/27/18 2123                Author: Annelyse Rey, Rhea Pink, MD  Service: --  Author Type: Physician       Filed: 01/27/18 2148  Date of Service: 01/27/18 2123  Status: Signed          Editor: Emiel Kielty, Rhea Pink, MD (Physician)               EMERGENCY DEPARTMENT HISTORY AND PHYSICAL EXAM           Date: 01/27/2018   Patient Name: Rachel Mcfarland        History of Presenting Illness          Chief Complaint       Patient presents with        ?  Medication Refill             out of levothyroxine; reports sore throat and pain with swallowing; out x 1 month; new to area           History Provided By: Patient      HPI: [place],  22 y.o. female with PMHx significant for hypothyroidism who presents ambulatory to the ED  with cc of sore throat and being out of thyroid medication.  Patient states she is here for the summer from [place], and has run out of her Synthroid 125 mcg daily.  She states she has been out for 1 month.  She states that her endocrinologist  is in [place] will not refill her meds here in [place], but she is returning there later this month.      She also complains of sore throat for 3 or 4 days, and a swollen tender area at the top of her right neck under her jaw.  She does not have any fever.  She has mild headache.  The pain is aggravated by touching the swollen area, swallowing, and moving  her neck.  She rates the pain a 7 of 10.  She also complains of urinary frequency and burning and states she has frequent UTIs.      PMHx: Hypothyroid, asthma, borderline diabetes, major depressive disorder   PSHx: None growth removed    Social Hx: Occasional EtOH; yes smoker; no illicit Drugs      PCP: None      There are no other complaints, changes, or physical findings at this time.        Current Outpatient Medications          Medication  Sig  Dispense  Refill           ?  levothyroxine (SYNTHROID) 125 mcg tablet  Take 1 Tab by mouth Daily  (before breakfast).  30 Tab  0     ?  cephALEXin (KEFLEX) 500 mg capsule  Take 1 Cap by mouth three (3) times daily for 10 days.  30 Cap  0           ?  fluconazole (DIFLUCAN) 150 mg tablet  Take 1 Tab by mouth daily for 1 day. FDA advises cautious prescribing of oral fluconazole in pregnancy.  1 Tab  0          Past History        Past Medical History:   History reviewed. No pertinent past medical history.   Past Surgical History:   History reviewed. No pertinent surgical history.   Family  History:   History reviewed. No pertinent family history.   Social History:     Social History          Tobacco Use         ?  Smoking status:  Current Every Day Smoker     ?  Smokeless tobacco:  Never Used       Substance Use Topics         ?  Alcohol use:  Yes         ?  Drug use:  Not Currently        Allergies:   No Known Allergies     Review of Systems     Review of Systems    Constitutional: Negative for chills, fatigue and fever.    HENT: Positive for sore throat.     Eyes: Negative for photophobia and visual disturbance.    Respiratory: Negative for shortness of breath.     Cardiovascular: Negative for chest pain.    All other systems reviewed and are negative.        Physical Exam     Physical Exam    Constitutional: She is oriented to person, place, and time. She appears well-developed and well-nourished. No distress.    HENT:    Head: Normocephalic and atraumatic.   Mild oropharyngeal erythema, no exudate    Eyes: Conjunctivae are normal.    Neck: Normal range of motion. Neck supple. No JVD present. No tracheal deviation present. No thyromegaly present.    Cardiovascular: Normal rate, regular rhythm, normal heart sounds and intact distal pulses.    Pulmonary/Chest: Effort normal and breath sounds normal. No stridor. No respiratory distress. She has no wheezes. She has no rales.   Lymphadenopathy:     She has  cervical adenopathy (Tender lymphadenopathy, right anterior cervical).   Neurological: She is alert and  oriented to person, place, and time.    Skin: Skin is warm. She is not diaphoretic.   Psychiatric: She has a normal mood and affect.    Nursing note and vitals reviewed.        Diagnostic Study Results     Labs -         Recent Results (from the past 12 hour(s))     STREP AG SCREEN, GROUP A          Collection Time: 01/27/18  8:33 PM         Result  Value  Ref Range            Group A Strep Ag ID  NEGATIVE   NEG         URINALYSIS W/ REFLEX CULTURE          Collection Time: 01/27/18  8:33 PM         Result  Value  Ref Range            Color  YELLOW/STRAW          Appearance  CLOUDY (A)  CLEAR         Specific gravity  1.015  1.003 - 1.030         pH (UA)  7.5  5.0 - 8.0         Protein  NEGATIVE   NEG mg/dL       Glucose  NEGATIVE   NEG mg/dL       Ketone  TRACE (A)  NEG mg/dL       Bilirubin  NEGATIVE  NEG         Blood  NEGATIVE   NEG         Urobilinogen  1.0  0.2 - 1.0 EU/dL       Nitrites  POSITIVE (A)  NEG         Leukocyte Esterase  MODERATE (A)  NEG         WBC  10-20  0 - 4 /hpf       RBC  0-5  0 - 5 /hpf       Epithelial cells  MANY (A)  FEW /lpf       Bacteria  4+ (A)  NEG /hpf       UA:UC IF INDICATED  URINE CULTURE ORDERED (A)  CNI         Yeast w/hyphae  PRESENT (A)  NEG         HCG URINE, QL. - POC          Collection Time: 01/27/18  8:35 PM         Result  Value  Ref Range            Pregnancy test,urine (POC)  NEGATIVE   NEG             Radiologic Studies -      No orders to display        No results found.     Medical Decision Making     I am the first provider for this patient.      I reviewed the vital signs, available nursing notes, past medical history, past surgical history, family history and social history.      Vital Signs-Reviewed the patient's vital signs.   Patient Vitals for the past 12 hrs:           Temp  Pulse  Resp  BP           01/27/18 1929  98.4 ??F (36.9 ??C)  91  18  136/88              Records Reviewed: Nursing Notes      Provider Notes (Medical Decision Making):    Strep  pharyngitis, lymphadenitis, viral pharyngitis, UTI, pregnancy, vaginitis      ED Course:    Initial assessment performed. The patients presenting problems have been discussed, and they are in agreement with the care plan formulated and outlined with them.  I have encouraged them to ask questions as they arise throughout their visit.             Progress Note:       Updated pt on all returned results and findings. Discussed the importance of proper follow up as referred below along with return precautions. Pt in agreement with the care plan and expresses agreement with and understanding of all items discussed.      Disposition:   Discharge      PLAN:   1.      Current Discharge Medication List              START taking these medications          Details        levothyroxine (SYNTHROID) 125 mcg tablet  Take 1 Tab by mouth Daily (before breakfast).   Qty: 30 Tab, Refills:  0               cephALEXin (KEFLEX) 500 mg capsule  Take 1 Cap by mouth three (3)  times daily for 10 days.   Qty: 30 Cap, Refills:  0               fluconazole (DIFLUCAN) 150 mg tablet  Take 1 Tab by mouth daily for 1 day. FDA advises cautious prescribing of oral fluconazole in pregnancy.   Qty: 1 Tab, Refills:  0                      2.      Follow-up Information               Follow up With  Specialties  Details  Why  Contact Info              None    In 1 month  with your PCP/endocrinologist in [place]  None (395) Patient stated that they have no PCP                 [facility] EMERGENCY DEPT  Emergency Medicine    As needed, If symptoms worsen  [address] [place] [address]   [phone]             Return to ED if worse         Diagnosis        Clinical Impression:       1.  Hypothyroidism, unspecified type      2.  Lymphadenitis      3.  Acute cystitis without hematuria         4.  Vaginal candida

## 2018-01-27 NOTE — ED Notes (Signed)
 Patient given copy of dc instructions and 0 paper script(s) and 3 electronic scripts.  Patient  verbalized understanding of instructions and script (s).  Patient given a current medication reconciliation form and verbalized understanding of their medications.   Patient  verbalized understanding of the importance of discussing medications with  his or her physician or clinic they will be following up with.  Patient alert and oriented and in no acute distress.  Patient offered wheelchair from treatment area to hospital entrance, patient declined wheelchair.

## 2018-01-27 NOTE — ED Notes (Signed)
 Patient presents to the ED with c/o a medication refill. Pt reports being out of levothyroxine for one month. Pt reports a sore throat when swallowing x1 week.     Pt is alert and oriented. Pt skin is warm and dry. Pt is ambulatory independently. Pt throat is sore with palpation.       Emergency Department Nursing Plan of Care       The Nursing Plan of Care is developed from the Nursing assessment and Emergency Department Attending provider initial evaluation.  The plan of care may be reviewed in the "ED Provider note".    The Plan of Care was developed with the following considerations:   Patient / Family readiness to learn indicated ab:czmajopszi understanding  Persons(s) to be included in education: patient  Barriers to Learning/Limitations:No    Signed     Kaitlin M Schult    01/27/2018   8:21 PM

## 2018-01-28 LAB — HCG URINE, QL. - POC
HCG, Pregnancy, Urine, POC: NEGATIVE
Pregnancy test,urine (POC): NEGATIVE

## 2018-01-28 LAB — URINALYSIS W/ REFLEX CULTURE
Bilirubin, Urine: NEGATIVE
Bilirubin: NEGATIVE
Blood, Urine: NEGATIVE
Blood: NEGATIVE
Glucose, Ur: NEGATIVE mg/dL
Glucose: NEGATIVE mg/dL
Nitrite, Urine: POSITIVE — AB
Nitrites: POSITIVE — AB
Protein, UA: NEGATIVE mg/dL
Protein: NEGATIVE mg/dL
Specific Gravity, UA: 1.015 (ref 1.003–1.030)
Specific gravity: 1.015 (ref 1.003–1.030)
Urobilinogen, UA, POCT: 1 EU/dL (ref 0.2–1.0)
Urobilinogen: 1 EU/dL (ref 0.2–1.0)
pH (UA): 7.5 (ref 5.0–8.0)
pH, UA: 7.5 (ref 5.0–8.0)

## 2018-01-28 LAB — STREP AG SCREEN, GROUP A
Group A Strep Ag ID: NEGATIVE
Strep A Ag: NEGATIVE

## 2018-01-28 MED ORDER — FLUCONAZOLE 150 MG TAB
150 mg | ORAL_TABLET | Freq: Every day | ORAL | 0 refills | Status: AC
Start: 2018-01-28 — End: 2018-01-28

## 2018-01-28 MED ORDER — IBUPROFEN 400 MG TAB
400 mg | Freq: Once | ORAL | Status: AC
Start: 2018-01-28 — End: 2018-01-27
  Administered 2018-01-28: 01:00:00 via ORAL

## 2018-01-28 MED ORDER — LEVOTHYROXINE 125 MCG TAB
125 mcg | ORAL_TABLET | Freq: Every day | ORAL | 0 refills | Status: AC
Start: 2018-01-28 — End: ?

## 2018-01-28 MED ORDER — CEPHALEXIN 500 MG CAP
500 mg | ORAL_CAPSULE | Freq: Three times a day (TID) | ORAL | 0 refills | Status: AC
Start: 2018-01-28 — End: 2018-02-06

## 2018-01-28 MED FILL — IBUPROFEN 400 MG TAB: 400 mg | ORAL | Qty: 2

## 2018-01-29 LAB — CULTURE, URINE
Colonies Counted: >100000
Colony Count: >100000

## 2018-01-30 LAB — CULTURE, THROAT
Culture result:: NORMAL
Culture: NORMAL

## 2018-04-01 ENCOUNTER — Encounter (HOSPITAL_COMMUNITY): Payer: Self-pay | Admitting: Emergency Medicine

## 2018-04-01 ENCOUNTER — Ambulatory Visit (HOSPITAL_COMMUNITY)
Admission: EM | Admit: 2018-04-01 | Discharge: 2018-04-01 | Disposition: A | Discharge status: Home / Self Care | Payer: Managed Care, Other (non HMO) | Attending: Family Medicine | Admitting: Family Medicine

## 2018-04-01 ENCOUNTER — Other Ambulatory Visit: Payer: Self-pay

## 2018-04-01 DIAGNOSIS — Z113 Encounter for screening for infections with a predominantly sexual mode of transmission: Secondary | ICD-10-CM | POA: Diagnosis not present

## 2018-04-01 DIAGNOSIS — B9689 Other specified bacterial agents as the cause of diseases classified elsewhere: Secondary | ICD-10-CM | POA: Insufficient documentation

## 2018-04-01 DIAGNOSIS — R1030 Lower abdominal pain, unspecified: Secondary | ICD-10-CM | POA: Insufficient documentation

## 2018-04-01 DIAGNOSIS — N939 Abnormal uterine and vaginal bleeding, unspecified: Secondary | ICD-10-CM | POA: Insufficient documentation

## 2018-04-01 DIAGNOSIS — N938 Other specified abnormal uterine and vaginal bleeding: Secondary | ICD-10-CM | POA: Diagnosis not present

## 2018-04-01 DIAGNOSIS — R109 Unspecified abdominal pain: Secondary | ICD-10-CM

## 2018-04-01 DIAGNOSIS — N760 Acute vaginitis: Secondary | ICD-10-CM | POA: Insufficient documentation

## 2018-04-01 DIAGNOSIS — Z3202 Encounter for pregnancy test, result negative: Secondary | ICD-10-CM | POA: Diagnosis not present

## 2018-04-01 DIAGNOSIS — E039 Hypothyroidism, unspecified: Secondary | ICD-10-CM | POA: Insufficient documentation

## 2018-04-01 DIAGNOSIS — Z79899 Other long term (current) drug therapy: Secondary | ICD-10-CM | POA: Diagnosis not present

## 2018-04-01 LAB — POCT I-STAT, CHEM 8
BUN: 6 mg/dL (ref 6–20)
CALCIUM ION: 1.22 mmol/L (ref 1.15–1.40)
Chloride: 101 mmol/L (ref 98–111)
Creatinine, Ser: 0.7 mg/dL (ref 0.44–1.00)
Glucose, Bld: 90 mg/dL (ref 70–99)
HCT: 38 % (ref 36.0–46.0)
Hemoglobin: 12.9 g/dL (ref 12.0–15.0)
Potassium: 4 mmol/L (ref 3.5–5.1)
Sodium: 139 mmol/L (ref 135–145)
TCO2: 27 mmol/L (ref 22–32)

## 2018-04-01 LAB — POCT URINALYSIS DIP (DEVICE)
Bilirubin Urine: NEGATIVE
GLUCOSE, UA: NEGATIVE mg/dL
Ketones, ur: NEGATIVE mg/dL
Leukocytes, UA: NEGATIVE
Nitrite: NEGATIVE
PH: 7.5 (ref 5.0–8.0)
PROTEIN: NEGATIVE mg/dL
SPECIFIC GRAVITY, URINE: 1.015 (ref 1.005–1.030)
UROBILINOGEN UA: 0.2 mg/dL (ref 0.0–1.0)

## 2018-04-01 LAB — CBC
HEMATOCRIT: 36.6 % (ref 36.0–46.0)
HEMOGLOBIN: 11.7 g/dL — AB (ref 12.0–15.0)
MCH: 31.8 pg (ref 26.0–34.0)
MCHC: 32 g/dL (ref 30.0–36.0)
MCV: 99.5 fL (ref 78.0–100.0)
Platelets: 239 10*3/uL (ref 150–400)
RBC: 3.68 MIL/uL — ABNORMAL LOW (ref 3.87–5.11)
RDW: 13.2 % (ref 11.5–15.5)
WBC: 11 10*3/uL — ABNORMAL HIGH (ref 4.0–10.5)

## 2018-04-01 LAB — POCT PREGNANCY, URINE: Preg Test, Ur: NEGATIVE

## 2018-04-01 MED ORDER — MEGESTROL ACETATE 40 MG PO TABS: 40.0000 mg | ORAL_TABLET | Freq: Two times a day (BID) | ORAL | 0 refills | Status: AC

## 2018-04-01 NOTE — ED Provider Notes (Signed)
[facility]    CSN: [id number] Arrival date & time: 04/01/18  1317     History   Chief Complaint Chief Complaint  Patient presents with  . Abdominal Pain  . Back Pain    HPI Danielle Rosales is a 22 y.o. female history of hypothyroidism presenting today for evaluation of vaginal bleeding and abdominal pain.  Patient states that for the past month she has had her cycle off and on, initially began on August 9, lasted for a week, was off for about 5 days and then started again when she typically has her cycles around the 19th of the month, symptoms again persisted for a week, stopped for a few days and started approximately 5 days ago again.  Over the past week she has started to have some abdominal discomfort and pain on her sides.  She is also noted that the bleeding is heavier than normal.  She is changing tampons frequently.  Denies previous issues with her cycle.  Is not on any form of birth control.  Patient is sexually active, denies abdominal pain or abnormal discharge prior to onset of symptoms.  Normal menstrual cycle in July.  She is not taking any medicines for symptoms.  Denies any fevers.  Denies nausea, vomiting.  Tolerating oral intake like normal.  Patient does endorse constipation, but states that this is normal for her. HPI  Past Medical History:  Diagnosis Date  . Thyroid disease     There are no active problems to display for this patient.   History reviewed. No pertinent surgical history.  OB History   None      Home Medications    Prior to Admission medications   Medication Sig Start Date End Date Taking? Authorizing Provider  Levothyroxine Sodium (LEVOTHROID PO) Take by mouth.   Yes [provider]  Loratadine (CLARITIN PO) Take by mouth.   Yes [provider]  ALBUTEROL IN Inhale into the lungs.    [provider]  amphetamine-dextroamphetamine (ADDERALL) 15 MG tablet Take 15 mg by mouth daily.    [provider]  megestrol (MEGACE) 40 MG tablet Take 1 tablet (40 mg total) by mouth 2 (two) times daily for 7 days. Until bleeding stops 04/01/18 04/08/18  Wieters, Hallie C, PA-C  polyethylene glycol powder (GLYCOLAX/MIRALAX) powder Take 17 g by mouth daily. 12/05/16   Isa Rankin, MD  temazepam (RESTORIL) 7.5 MG capsule Take 7.5 mg by mouth at bedtime as needed for sleep.    [provider]  venlafaxine (EFFEXOR) 75 MG tablet Take 75 mg by mouth 2 (two) times daily.    [provider]    Family History Family History  Problem Relation Age of Onset  . Cancer Mother     Social History Social History   Tobacco Use  . Smoking status: Never Smoker  . Smokeless tobacco: Never Used  Substance Use Topics  . Alcohol use: No  . Drug use: No     Allergies   Patient has no known allergies.   Review of Systems Review of Systems  Constitutional: Negative for activity change, appetite change, fatigue and fever.  Respiratory: Negative for shortness of breath.   Cardiovascular: Negative for chest pain and leg swelling.  Gastrointestinal: Positive for abdominal pain and constipation. Negative for diarrhea, nausea and vomiting.  Genitourinary: Positive for flank pain and vaginal bleeding. Negative for dysuria, frequency, genital sores, hematuria, menstrual problem, vaginal discharge and vaginal pain.  Musculoskeletal: Negative for back  pain.  Skin: Negative for rash.  Neurological: Negative for dizziness, light-headedness and headaches.     Physical Exam Triage Vital Signs ED Triage Vitals  Enc Vitals Group     BP 04/01/18 1413 124/70     Pulse Rate 04/01/18 1413 69     Resp --      Temp 04/01/18 1413 98.9 F (37.2 C)     Temp Source 04/01/18 1413 Oral     SpO2 04/01/18 1413 100 %     Weight --      Height --      Head Circumference --      Peak Flow --      Pain Score 04/01/18 1411 4     Pain Loc --      Pain Edu? --      Excl. in GC? --    No  data found.  Updated Vital Signs BP 124/70 (BP Location: Left Arm)   Pulse 69   Temp 98.9 F (37.2 C) (Oral)   LMP 03/23/2018 (Exact Date)   SpO2 100%   Visual Acuity Right Eye Distance:   Left Eye Distance:   Bilateral Distance:    Right Eye Near:   Left Eye Near:    Bilateral Near:     Physical Exam  Constitutional: She appears well-developed and well-nourished. No distress.  No acute distress, moving in room easily without worsening of pain  HENT:  Head: Normocephalic and atraumatic.  Oral mucosa pink and moist, no tonsillar enlargement or exudate. Posterior pharynx patent and nonerythematous, no uvula deviation or swelling. Normal phonation.  Eyes: Pupils are equal, round, and reactive to light. Conjunctivae and EOM are normal.  Neck: Neck supple.  Cardiovascular: Normal rate and regular rhythm.  No murmur heard. Pulmonary/Chest: Effort normal and breath sounds normal. No respiratory distress.  Abdominal: Soft. There is no tenderness.  Mild tenderness to palpation of bilateral lower quadrants, worse on right, negative McBurney's, increases as approaching closer to the pelvic region  Genitourinary:  Genitourinary Comments: Normal external female genitalia, no external signs of bleeding, dark red blood in vaginal vault, no cervical erythema or other discharge, no cervical motion tenderness  Musculoskeletal: She exhibits no edema.  Neurological: She is alert.  Skin: Skin is warm and dry.  Psychiatric: She has a normal mood and affect.  Nursing note and vitals reviewed.    UC Treatments / Results  Labs (all labs ordered are listed, but only abnormal results are displayed) Labs Reviewed  CBC - Abnormal; Notable for the following components:      Result Value   WBC 11.0 (*)    RBC 3.68 (*)    Hemoglobin 11.7 (*)    All other components within normal limits  POCT URINALYSIS DIP (DEVICE) - Abnormal; Notable for the following components:   Hgb urine dipstick TRACE (*)     All other components within normal limits  POCT PREGNANCY, URINE  POCT I-STAT, CHEM 8  CERVICOVAGINAL ANCILLARY ONLY    EKG None  Radiology No results found.  Procedures Procedures (including critical care time)  Medications Ordered in UC Medications - No data to display  Initial Impression / Assessment and Plan / UC Course  I have reviewed the triage vital signs and the nursing notes.  Pertinent labs & imaging results that were available during my care of the patient were reviewed by me and considered in my medical decision making (see chart for details).    Pregnancy test negative, UA unremarkable.  Patient with abnormal vaginal bleeding, hemoglobin stable at 12.9, will provide Megace to take twice daily until bleeding stops.  Follow-up with women's clinic for further evaluation of abnormal bleeding.  Dietary increase in iron.  Swab obtained to check for STDs as cause of abnormal bleeding, CBC obtained to check white blood cells, will call if abnormal and have go to emergency room for further work-up of abdominal pain/pelvic pain.Discussed strict return precautions. Patient verbalized understanding and is agreeable with plan.  Final Clinical Impressions(s) / UC Diagnoses   Final diagnoses:  Lower abdominal pain  Vaginal bleeding     Discharge Instructions     Please begin taking Megace twice daily until bleeding stops Please follow-up with women's clinic for further evaluation of bleeding Please incorporate iron into your diet  We will send the swab off to check for STDs, we will call you if anything comes back abnormal We drew blood work as well, we will call you if any results come back abnormal concerning for going to the emergency room for further work-up of your abdominal pain  Please continue to monitor your symptoms, if you develop fever, nausea, vomiting, worsening abdominal pain, lightheadedness or dizziness, please go to emergency room     ED  Prescriptions    Medication Sig Dispense Auth. Provider   megestrol (MEGACE) 40 MG tablet Take 1 tablet (40 mg total) by mouth 2 (two) times daily for 7 days. Until bleeding stops 20 tablet Wieters, Hallie C, PA-C     Controlled Substance Prescriptions [place] Controlled Substance Registry consulted? Not Applicable   Lew Dawes, [place] 04/01/18 1604

## 2018-04-01 NOTE — ED Triage Notes (Signed)
Pt reports bilateral thoracic back/flank pain x1 week with no associated urinary issues. Pt states she started her period on Aug. 9 and it lasted for three weeks then she states that it returned a few days later and she has been bleeding since.   Pt reports having to change her tampons every 30 minutes.

## 2018-04-01 NOTE — Discharge Instructions (Addendum)
Please begin taking Megace twice daily until bleeding stops Please follow-up with women's clinic for further evaluation of bleeding Please incorporate iron into your diet  We will send the swab off to check for STDs, we will call you if anything comes back abnormal We drew blood work as well, we will call you if any results come back abnormal concerning for going to the emergency room for further work-up of your abdominal pain  Please continue to monitor your symptoms, if you develop fever, nausea, vomiting, worsening abdominal pain, lightheadedness or dizziness, please go to emergency room

## 2018-04-02 ENCOUNTER — Telehealth (HOSPITAL_COMMUNITY): Payer: Self-pay

## 2018-04-02 NOTE — Telephone Encounter (Signed)
Attempted to reach patient to instruct to follow [facility]. No answer at this time.

## 2018-04-03 LAB — CERVICOVAGINAL ANCILLARY ONLY
Bacterial vaginitis: POSITIVE — AB
CANDIDA VAGINITIS: NEGATIVE
Chlamydia: POSITIVE — AB
Neisseria Gonorrhea: POSITIVE — AB
TRICH (WINDOWPATH): NEGATIVE

## 2018-04-06 ENCOUNTER — Telehealth (HOSPITAL_COMMUNITY): Payer: Self-pay

## 2018-04-06 MED ORDER — METRONIDAZOLE 500 MG PO TABS: 500.0000 mg | ORAL_TABLET | Freq: Two times a day (BID) | ORAL | 0 refills | Status: DC

## 2018-04-06 NOTE — Telephone Encounter (Signed)
Gonorrhea and chlamydia is positive.  Patient should return as soon as possible to the urgent care for treatment with IM rocephin 250mg  and po zithromax 1g. Patient will not need to see a provider unless there are new symptoms she would like evaluated. Need to educate patient to refrain from sexual intercourse for now and for 7 days after treatment to give the medicine time to work. Sexual partners need to be notified and tested/treated. Condoms may reduce risk of reinfection.  [facility] notified.   Bacterial vaginosis is positive. This was not treated at the urgent care visit.  Flagyl 500 mg BID x 7 days #14 no refills sent to patients pharmacy of choice per Dr. Dayton ScrapeMurray.   No answer at this time and no voicemail is available. Message sent to my chart.

## 2018-04-07 ENCOUNTER — Telehealth (HOSPITAL_COMMUNITY): Payer: Self-pay

## 2018-04-07 NOTE — Telephone Encounter (Signed)
No answer x2 

## 2018-04-09 ENCOUNTER — Telehealth (HOSPITAL_COMMUNITY): Payer: Self-pay

## 2018-04-09 NOTE — Telephone Encounter (Signed)
No answer x 3. Letter sent. 

## 2018-04-13 ENCOUNTER — Encounter: Payer: Self-pay | Admitting: Student

## 2018-04-13 ENCOUNTER — Encounter: Payer: Managed Care, Other (non HMO) | Admitting: Student

## 2018-10-03 ENCOUNTER — Emergency Department (HOSPITAL_COMMUNITY)
Admission: EM | Admit: 2018-10-03 | Discharge: 2018-10-03 | Disposition: A | Discharge status: Home / Self Care | Payer: Managed Care, Other (non HMO) | Attending: Emergency Medicine | Admitting: Emergency Medicine

## 2018-10-03 ENCOUNTER — Encounter (HOSPITAL_COMMUNITY): Payer: Self-pay | Admitting: Emergency Medicine

## 2018-10-03 ENCOUNTER — Other Ambulatory Visit: Payer: Self-pay

## 2018-10-03 DIAGNOSIS — Y929 Unspecified place or not applicable: Secondary | ICD-10-CM | POA: Insufficient documentation

## 2018-10-03 DIAGNOSIS — S299XXA Unspecified injury of thorax, initial encounter: Secondary | ICD-10-CM | POA: Insufficient documentation

## 2018-10-03 DIAGNOSIS — S298XXA Other specified injuries of thorax, initial encounter: Secondary | ICD-10-CM

## 2018-10-03 DIAGNOSIS — Z79899 Other long term (current) drug therapy: Secondary | ICD-10-CM | POA: Insufficient documentation

## 2018-10-03 DIAGNOSIS — Y999 Unspecified external cause status: Secondary | ICD-10-CM | POA: Insufficient documentation

## 2018-10-03 DIAGNOSIS — Y9389 Activity, other specified: Secondary | ICD-10-CM | POA: Insufficient documentation

## 2018-10-03 DIAGNOSIS — E079 Disorder of thyroid, unspecified: Secondary | ICD-10-CM | POA: Insufficient documentation

## 2018-10-03 DIAGNOSIS — S060X0A Concussion without loss of consciousness, initial encounter: Secondary | ICD-10-CM | POA: Insufficient documentation

## 2018-10-03 NOTE — ED Notes (Signed)
Patient verbalizes understanding of discharge instructions. Opportunity for questioning and answers were provided. Armband removed by staff, pt discharged from ED home via POV.  
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
## 2018-12-17 ENCOUNTER — Other Ambulatory Visit: Payer: Self-pay

## 2018-12-17 ENCOUNTER — Encounter (HOSPITAL_COMMUNITY): Payer: Self-pay

## 2018-12-17 ENCOUNTER — Ambulatory Visit (HOSPITAL_COMMUNITY)
Admission: EM | Admit: 2018-12-17 | Discharge: 2018-12-17 | Disposition: A | Discharge status: Home / Self Care | Payer: Medicaid Other | Attending: Internal Medicine | Admitting: Internal Medicine

## 2018-12-17 DIAGNOSIS — N939 Abnormal uterine and vaginal bleeding, unspecified: Secondary | ICD-10-CM | POA: Insufficient documentation

## 2018-12-17 DIAGNOSIS — R5383 Other fatigue: Secondary | ICD-10-CM

## 2018-12-17 LAB — CBC
HCT: 32.8 % — ABNORMAL LOW (ref 36.0–46.0)
Hemoglobin: 11.1 g/dL — ABNORMAL LOW (ref 12.0–15.0)
MCH: 33.5 pg (ref 26.0–34.0)
MCHC: 33.8 g/dL (ref 30.0–36.0)
MCV: 99.1 fL (ref 80.0–100.0)
Platelets: 177 10*3/uL (ref 150–400)
RBC: 3.31 MIL/uL — ABNORMAL LOW (ref 3.87–5.11)
RDW: 13.6 % (ref 11.5–15.5)
WBC: 5.2 10*3/uL (ref 4.0–10.5)
nRBC: 0 % (ref 0.0–0.2)

## 2018-12-17 LAB — TSH: TSH: 81.054 u[IU]/mL — ABNORMAL HIGH (ref 0.350–4.500)

## 2018-12-17 NOTE — ED Triage Notes (Signed)
Pt states she has had her period. Pt states she's having heavy bleeding x 5 days.

## 2018-12-17 NOTE — ED Provider Notes (Signed)
[facility]    CSN: [id number] Arrival date & time: 12/17/18  1844     History   Chief Complaint No chief complaint on file.   HPI Silveria Lako is a 23 y.o. female with a history of hypothyroidism comes to emergency department with complaints of increased fatigue, feeling unusually cold and heavy vaginal bleeding.  Terms started insidiously and is gotten progressively pronounced.  Patient has a history of hypothyroidism on Synthroid.  She has not checked her TSH levels in a long time.  She denies any weight gain.  Patient has experienced increased menstrual flow and shortened menstrual cycle this month.  She has some dizziness without syncope.  No nausea or vomiting.  HPI  Past Medical History:  Diagnosis Date  . Thyroid disease     There are no active problems to display for this patient.   No past surgical history on file.  OB History   No obstetric history on file.      Home Medications    Prior to Admission medications   Medication Sig Start Date End Date Taking? Authorizing Provider  ALBUTEROL IN Inhale into the lungs.    [provider]  amphetamine-dextroamphetamine (ADDERALL) 15 MG tablet Take 15 mg by mouth daily.    [provider]  Levothyroxine Sodium (LEVOTHROID PO) Take by mouth.    [provider]  Loratadine (CLARITIN PO) Take by mouth.    [provider]  metroNIDAZOLE (FLAGYL) 500 MG tablet Take 1 tablet (500 mg total) by mouth 2 (two) times daily. 04/06/18   Eustace Moore, MD  polyethylene glycol powder (GLYCOLAX/MIRALAX) powder Take 17 g by mouth daily. 12/05/16   Isa Rankin, MD  temazepam (RESTORIL) 7.5 MG capsule Take 7.5 mg by mouth at bedtime as needed for sleep.    [provider]  venlafaxine (EFFEXOR) 75 MG tablet Take 75 mg by mouth 2 (two) times daily.    [provider]    Family History Family History  Problem Relation Age of Onset  . Cancer Mother     Social History Social History   Tobacco Use  . Smoking status: Never Smoker  . Smokeless tobacco: Never Used  Substance Use Topics  . Alcohol use: No  . Drug use: Yes    Types: Marijuana     Allergies   Patient has no known allergies.   Review of Systems Review of Systems  Constitutional: Positive for activity change. Negative for appetite change, chills, fatigue and fever.  HENT: Negative for congestion, hearing loss, rhinorrhea and sore throat.   Respiratory: Negative.   Gastrointestinal: Negative for abdominal distention, abdominal pain, diarrhea, nausea and vomiting.  Endocrine: Positive for cold intolerance. Negative for heat intolerance, polydipsia, polyphagia and polyuria.  Genitourinary: Positive for vaginal bleeding. Negative for dysuria, frequency and urgency.  Musculoskeletal: Negative for arthralgias, back pain, gait problem, joint swelling, myalgias and neck pain.  Neurological: Negative for dizziness, syncope, numbness and headaches.  Psychiatric/Behavioral: Negative.      Physical Exam Triage Vital Signs ED Triage Vitals  Enc Vitals Group     BP      Pulse      Resp      Temp      Temp src      SpO2      Weight      Height      Head Circumference      Peak Flow      Pain Score  Pain Loc      Pain Edu?      Excl. in GC?    No data found.  Updated Vital Signs There were no vitals taken for this visit.  Visual Acuity Right Eye Distance:   Left Eye Distance:   Bilateral Distance:    Right Eye Near:   Left Eye Near:    Bilateral Near:     Physical Exam Constitutional:      General: She is not in acute distress.    Appearance: Normal appearance. She is not ill-appearing.  Cardiovascular:     Rate and Rhythm: Normal rate and regular rhythm.     Pulses: Normal pulses.     Heart sounds: Normal heart sounds.  Pulmonary:     Effort: Pulmonary effort is normal. No respiratory distress.     Breath sounds: Normal breath sounds. No  stridor. No wheezing or rhonchi.  Abdominal:     General: Bowel sounds are normal. There is no distension.     Palpations: Abdomen is soft. There is no mass.     Tenderness: There is no abdominal tenderness.  Skin:    General: Skin is warm.     Capillary Refill: Capillary refill takes less than 2 seconds.     Coloration: Skin is not jaundiced.     Findings: No bruising.  Neurological:     General: No focal deficit present.     Mental Status: She is alert and oriented to person, place, and time.      UC Treatments / Results  Labs (all labs ordered are listed, but only abnormal results are displayed) Labs Reviewed - No data to display  EKG None  Radiology No results found.  Procedures Procedures (including critical care time)  Medications Ordered in UC Medications - No data to display  Initial Impression / Assessment and Plan / UC Course  I have reviewed the triage vital signs and the nursing notes.  Pertinent labs & imaging results that were available during my care of the patient were reviewed by me and considered in my medical decision making (see chart for details).     1.  Increasing fatigue likely secondary to uncontrolled hypothyroidism: Free T4/TSH Patient may need Synthroid to be adjusted  2.  Menorrhagia with fatigue: CBC Menorrhagia is likely secondary to uncontrolled hypothyroidism If patient is iron deficient she will need iron supplementation  Patient is advised to come to urgent care if she has worsening symptoms. Final Clinical Impressions(s) / UC Diagnoses   Final diagnoses:  None   Discharge Instructions   None    ED Prescriptions    None     Controlled Substance Prescriptions [place] Controlled Substance Registry consulted? No   Merrilee JanskyLamptey, Philip O, MD 12/17/18 2002

## 2018-12-31 ENCOUNTER — Encounter (HOSPITAL_COMMUNITY): Payer: Self-pay

## 2018-12-31 ENCOUNTER — Other Ambulatory Visit: Payer: Self-pay

## 2018-12-31 ENCOUNTER — Ambulatory Visit (HOSPITAL_COMMUNITY)
Admission: EM | Admit: 2018-12-31 | Discharge: 2018-12-31 | Disposition: A | Discharge status: Home / Self Care | Payer: Medicaid Other | Attending: Family Medicine | Admitting: Family Medicine

## 2018-12-31 DIAGNOSIS — N898 Other specified noninflammatory disorders of vagina: Secondary | ICD-10-CM

## 2018-12-31 DIAGNOSIS — Z3202 Encounter for pregnancy test, result negative: Secondary | ICD-10-CM

## 2018-12-31 DIAGNOSIS — N939 Abnormal uterine and vaginal bleeding, unspecified: Secondary | ICD-10-CM

## 2018-12-31 DIAGNOSIS — E039 Hypothyroidism, unspecified: Secondary | ICD-10-CM

## 2018-12-31 LAB — POCT PREGNANCY, URINE: Preg Test, Ur: NEGATIVE

## 2018-12-31 MED ORDER — AZITHROMYCIN 250 MG PO TABS
ORAL_TABLET | ORAL | Status: AC
  Filled 2018-12-31: qty 4

## 2018-12-31 MED ORDER — AZITHROMYCIN 250 MG PO TABS
1000.0000 mg | ORAL_TABLET | Freq: Once | ORAL | Status: AC
  Administered 2018-12-31: 1000 mg via ORAL

## 2018-12-31 MED ORDER — CEFTRIAXONE SODIUM 250 MG IJ SOLR
250.0000 mg | Freq: Once | INTRAMUSCULAR | Status: AC
  Administered 2018-12-31: 250 mg via INTRAMUSCULAR

## 2018-12-31 MED ORDER — LEVOTHYROXINE SODIUM 175 MCG PO TABS: 175.0000 ug | ORAL_TABLET | Freq: Every day | ORAL | 2 refills | Status: AC

## 2018-12-31 MED ORDER — CEFTRIAXONE SODIUM 250 MG IJ SOLR
INTRAMUSCULAR | Status: AC
  Filled 2018-12-31: qty 250

## 2018-12-31 NOTE — ED Triage Notes (Signed)
Pt states she has started spotting 5 days. Pt states it's brownish in color. She would also like to get her lab results from 2 weeks ago.

## 2018-12-31 NOTE — Discharge Instructions (Addendum)

## 2018-12-31 NOTE — ED Provider Notes (Signed)
[facility]   [id number] 12/31/18 Arrival Time: 9211  ASSESSMENT & PLAN:  1. Hypothyroidism, unspecified type   2. Vaginal spotting   3. Vaginal discharge    Will increase levothyroxine to 114mcg.  Meds ordered this encounter  Medications  . cefTRIAXone (ROCEPHIN) injection 250 mg  . azithromycin (ZITHROMAX) tablet 1,000 mg  . levothyroxine (SYNTHROID) 175 MCG tablet    Sig: Take 1 tablet (175 mcg total) by mouth daily before breakfast.    Dispense:  30 tablet    Refill:  2   UPT: negative.   Discharge Instructions     You have been given the following medications today for treatment of suspected gonorrhea and/or chlamydia:  cefTRIAXone (ROCEPHIN) injection 250 mg azithromycin (ZITHROMAX) tablet 1,000 mg  Even though we have treated you today, we have sent testing for sexually transmitted infections. We will notify you of any positive results once they are received. If required, we will prescribe any medications you might need.  Please refrain from all sexual activity for at least the next seven days.    Pending: CERVICOVAGINAL ANCILLARY ONLY   Will notify of any positive results. Instructed to refrain from sexual activity for at least seven days.  Reviewed expectations re: course of current medical issues. Questions answered. Outlined signs and symptoms indicating need for more acute intervention. Patient verbalized understanding. After Visit Summary given.   SUBJECTIVE:  Danielle Rosales is a 23 y.o. female who presents with complaint of vaginal discharge. Noticed after spotting last week (resolved). Discharge described as thick and "dark". Urinary symptoms: none. Afebrile. No abdominal or pelvic pain. No n/v. No rashes or lesions. Sexually active with single female partner without regular condom use. OTC treatment: none reported.  Seen here on 12/17/2018; note reviewed. TSH returned: 81. Taking levothyroxine at 152mcg. Reduced from 145mcg approx 2  months ago. Still with fatigue.  Patient's last menstrual period was 12/04/2018.  ROS: As per HPI. All other systems negative.   OBJECTIVE:  Vitals:   12/31/18 1201 12/31/18 1203  BP:  114/66  Pulse:  86  Resp:  18  Temp:  98.1 F (36.7 C)  TempSrc:  Oral  SpO2:  99%  Weight: 68.9 kg     General appearance: alert, cooperative, appears stated age and no distress Throat: lips, mucosa, and tongue normal; teeth and gums normal CV: RRR Lungs: CTAB Back: no CVA tenderness; FROM at waist Abdomen: soft, non-tender; no guarding or rebound tenderness GU: deferred Skin: warm and dry Psychological: alert and cooperative; normal mood and affect.  Results for orders placed or performed during the hospital encounter of 12/17/18  CBC  Result Value Ref Range   WBC 5.2 4.0 - 10.5 K/uL   RBC 3.31 (L) 3.87 - 5.11 MIL/uL   Hemoglobin 11.1 (L) 12.0 - 15.0 g/dL   HCT 32.8 (L) 36.0 - 46.0 %   MCV 99.1 80.0 - 100.0 fL   MCH 33.5 26.0 - 34.0 pg   MCHC 33.8 30.0 - 36.0 g/dL   RDW 13.6 11.5 - 15.5 %   Platelets 177 150 - 400 K/uL   nRBC 0.0 0.0 - 0.2 %  TSH  Result Value Ref Range   TSH 81.054 (H) 0.350 - 4.500 uIU/mL    Labs Reviewed  POC URINE PREG, ED  CERVICOVAGINAL ANCILLARY ONLY    No Known Allergies  Past Medical History:  Diagnosis Date  . Thyroid disease    Family History  Problem Relation Age of Onset  . Cancer  Mother    Social History   Socioeconomic History  . Marital status: Single    Spouse name: Not on file  . Number of children: Not on file  . Years of education: Not on file  . Highest education level: Not on file  Occupational History  . Not on file  Social Needs  . Financial resource strain: Not on file  . Food insecurity    Worry: Not on file    Inability: Not on file  . Transportation needs    Medical: Not on file    Non-medical: Not on file  Tobacco Use  . Smoking status: Never Smoker  . Smokeless tobacco: Never Used  Substance and Sexual  Activity  . Alcohol use: No  . Drug use: Yes    Types: Marijuana  . Sexual activity: Not on file  Lifestyle  . Physical activity    Days per week: Not on file    Minutes per session: Not on file  . Stress: Not on file  Relationships  . Social Musicianconnections    Talks on phone: Not on file    Gets together: Not on file    Attends religious service: Not on file    Active member of club or organization: Not on file    Attends meetings of clubs or organizations: Not on file    Relationship status: Not on file  . Intimate partner violence    Fear of current or ex partner: Not on file    Emotionally abused: Not on file    Physically abused: Not on file    Forced sexual activity: Not on file  Other Topics Concern  . Not on file  Social History Narrative  . Not on file          Mardella LaymanHagler, Porshia Blizzard, MD 12/31/18 1425

## 2019-01-01 LAB — CERVICOVAGINAL ANCILLARY ONLY
Bacterial vaginitis: NEGATIVE
Candida vaginitis: NEGATIVE
Chlamydia: NEGATIVE
Neisseria Gonorrhea: NEGATIVE
Trichomonas: NEGATIVE

## 2019-01-04 ENCOUNTER — Encounter (HOSPITAL_COMMUNITY): Payer: Self-pay

## 2019-01-04 ENCOUNTER — Telehealth: Payer: Self-pay | Admitting: Advanced Practice Midwife

## 2019-01-04 ENCOUNTER — Other Ambulatory Visit: Payer: Self-pay

## 2019-01-04 ENCOUNTER — Emergency Department (HOSPITAL_COMMUNITY)
Admission: EM | Admit: 2019-01-04 | Discharge: 2019-01-04 | Disposition: A | Discharge status: Home / Self Care | Payer: Medicaid Other | Attending: Emergency Medicine | Admitting: Emergency Medicine

## 2019-01-04 DIAGNOSIS — R102 Pelvic and perineal pain: Secondary | ICD-10-CM | POA: Insufficient documentation

## 2019-01-04 DIAGNOSIS — Z79899 Other long term (current) drug therapy: Secondary | ICD-10-CM | POA: Insufficient documentation

## 2019-01-04 DIAGNOSIS — F1210 Cannabis abuse, uncomplicated: Secondary | ICD-10-CM | POA: Insufficient documentation

## 2019-01-04 DIAGNOSIS — E079 Disorder of thyroid, unspecified: Secondary | ICD-10-CM | POA: Insufficient documentation

## 2019-01-04 DIAGNOSIS — N939 Abnormal uterine and vaginal bleeding, unspecified: Secondary | ICD-10-CM | POA: Insufficient documentation

## 2019-01-04 LAB — URINALYSIS, ROUTINE W REFLEX MICROSCOPIC
Bilirubin Urine: NEGATIVE
Glucose, UA: NEGATIVE mg/dL
Ketones, ur: NEGATIVE mg/dL
Leukocytes,Ua: NEGATIVE
Nitrite: NEGATIVE
Protein, ur: NEGATIVE mg/dL
RBC / HPF: >50 RBC/hpf — ABNORMAL HIGH (ref 0–5)
Specific Gravity, Urine: 1.013 (ref 1.005–1.030)
pH: 8 (ref 5.0–8.0)

## 2019-01-04 LAB — CBC WITH DIFFERENTIAL/PLATELET
Abs Immature Granulocytes: 0.01 10*3/uL (ref 0.00–0.07)
Basophils Absolute: 0.1 10*3/uL (ref 0.0–0.1)
Basophils Relative: 1 %
Eosinophils Absolute: 0.4 10*3/uL (ref 0.0–0.5)
Eosinophils Relative: 6 %
HCT: 34.8 % — ABNORMAL LOW (ref 36.0–46.0)
Hemoglobin: 11.2 g/dL — ABNORMAL LOW (ref 12.0–15.0)
Immature Granulocytes: 0 %
Lymphocytes Relative: 38 %
Lymphs Abs: 2.4 10*3/uL (ref 0.7–4.0)
MCH: 32.7 pg (ref 26.0–34.0)
MCHC: 32.2 g/dL (ref 30.0–36.0)
MCV: 101.8 fL — ABNORMAL HIGH (ref 80.0–100.0)
Monocytes Absolute: 0.6 10*3/uL (ref 0.1–1.0)
Monocytes Relative: 10 %
Neutro Abs: 2.9 10*3/uL (ref 1.7–7.7)
Neutrophils Relative %: 45 %
Platelets: 216 10*3/uL (ref 150–400)
RBC: 3.42 MIL/uL — ABNORMAL LOW (ref 3.87–5.11)
RDW: 13.2 % (ref 11.5–15.5)
WBC: 6.3 10*3/uL (ref 4.0–10.5)
nRBC: 0 % (ref 0.0–0.2)

## 2019-01-04 LAB — BASIC METABOLIC PANEL
Anion gap: 7 (ref 5–15)
BUN: 9 mg/dL (ref 6–20)
CO2: 23 mmol/L (ref 22–32)
Calcium: 9.1 mg/dL (ref 8.9–10.3)
Chloride: 109 mmol/L (ref 98–111)
Creatinine, Ser: 0.78 mg/dL (ref 0.44–1.00)
GFR calc Af Amer: >60 mL/min (ref >60)
GFR calc non Af Amer: >60 mL/min (ref >60)
Glucose, Bld: 96 mg/dL (ref 70–99)
Potassium: 3.3 mmol/L — ABNORMAL LOW (ref 3.5–5.1)
Sodium: 139 mmol/L (ref 135–145)

## 2019-01-04 LAB — WET PREP, GENITAL
Clue Cells Wet Prep HPF POC: NONE SEEN
Sperm: NONE SEEN
Trich, Wet Prep: NONE SEEN
Yeast Wet Prep HPF POC: NONE SEEN

## 2019-01-04 LAB — I-STAT BETA HCG BLOOD, ED (MC, WL, AP ONLY): I-stat hCG, quantitative: <5 m[IU]/mL (ref <5)

## 2019-01-04 MED ORDER — IBUPROFEN 800 MG PO TABS
800.0000 mg | ORAL_TABLET | Freq: Once | ORAL | Status: AC
  Administered 2019-01-04: 800 mg via ORAL
  Filled 2019-01-04: qty 1

## 2019-01-04 NOTE — ED Triage Notes (Signed)
States she has had vaginal bleeding x 1 month. Pt states she had a lot of blood clots this morning, along with pain. Pt was seen before for same, and all tests came back negative. Pt has not seen OBGYN since.

## 2019-01-04 NOTE — ED Provider Notes (Signed)
[facility] DEPT Provider Note   CSN: [id number] Arrival date & time: 01/04/19  6237    History   Chief Complaint Chief Complaint  Patient presents with  . Vaginal Bleeding    HPI Danielle Rosales is a 23 y.o. female with history of hypothyroidism presented today for vaginal bleeding.  Patient reports 1 month of vaginal bleeding for which she has been evaluated multiple times, patient was seen on 12/31/2018 at urgent care, at that visit patient negative pregnancy test, cervical vaginal ancillary test negative for BV, Candida, chlamydia, gonorrhea and trichomonas.  She was treated with Rocephin 2 mg and azithromycin 1000 mg. Also increased daily levothyroxine.  Encouraged to follow-up with OB/GYN which she has not done.       HPI  Past Medical History:  Diagnosis Date  . Thyroid disease     There are no active problems to display for this patient.   History reviewed. No pertinent surgical history.   OB History   No obstetric history on file.      Home Medications    Prior to Admission medications   Medication Sig Start Date End Date Taking? Authorizing Provider  levothyroxine (SYNTHROID) 175 MCG tablet Take 1 tablet (175 mcg total) by mouth daily before breakfast. 12/31/18  Yes Hagler, Aaron Edelman, MD  venlafaxine (EFFEXOR) 75 MG tablet Take 75 mg by mouth 2 (two) times daily.    [provider]    Family History Family History  Problem Relation Age of Onset  . Cancer Mother     Social History Social History   Tobacco Use  . Smoking status: Never Smoker  . Smokeless tobacco: Never Used  Substance Use Topics  . Alcohol use: No  . Drug use: Yes    Types: Marijuana     Allergies   Patient has no known allergies.   Review of Systems Review of Systems  Constitutional: Negative.  Negative for chills and fever.  Gastrointestinal: Negative.  Negative for blood in stool, constipation, diarrhea, nausea and vomiting.   Genitourinary: Positive for pelvic pain (Cramping) and vaginal bleeding. Negative for dysuria, flank pain, hematuria and vaginal discharge.  All other systems reviewed and are negative.  Physical Exam Updated Vital Signs BP 117/71   Pulse (!) 54   Temp 99.1 F (37.3 C) (Oral)   Resp 18   LMP 12/04/2018   SpO2 100%   Physical Exam Constitutional:      General: She is not in acute distress.    Appearance: Normal appearance. She is well-developed. She is not ill-appearing or diaphoretic.  HENT:     Head: Normocephalic and atraumatic.     Right Ear: External ear normal.     Left Ear: External ear normal.     Nose: Nose normal.  Eyes:     General: Vision grossly intact. Gaze aligned appropriately.     Pupils: Pupils are equal, round, and reactive to light.  Neck:     Musculoskeletal: Normal range of motion.     Trachea: Trachea and phonation normal. No tracheal deviation.  Cardiovascular:     Rate and Rhythm: Normal rate and regular rhythm.  Pulmonary:     Effort: Pulmonary effort is normal. No respiratory distress.  Abdominal:     General: There is no distension.     Palpations: Abdomen is soft.     Tenderness: There is no abdominal tenderness. There is no guarding or rebound.  Genitourinary:    Comments: Exam chaperoned by Deadrea NT.  Pelvic exam: normal external genitalia without evidence of trauma. VULVA: normal appearing vulva with no masses, tenderness or lesion. VAGINA: normal appearing vagina with normal color and discharge, no lesions. CERVIX: normal appearing cervix without lesions, cervical motion tenderness absent, cervical os closed without purulent discharge; Vaginal Bleeding present. Wet prep and DNA probe for chlamydia and GC obtained.   ADNEXA: normal adnexa in size, nontender and no masses UTERUS: uterus is normal size, shape, consistency and nontender.  Musculoskeletal: Normal range of motion.     Right lower leg: No edema.     Left lower leg: No edema.   Skin:    General: Skin is warm and dry.  Neurological:     Mental Status: She is alert.     GCS: GCS eye subscore is 4. GCS verbal subscore is 5. GCS motor subscore is 6.     Comments: Speech is clear and goal oriented, follows commands Major Cranial nerves without deficit, no facial droop Moves extremities without ataxia, coordination intact  Psychiatric:        Behavior: Behavior normal.      ED Treatments / Results  Labs (all labs ordered are listed, but only abnormal results are displayed) Labs Reviewed  WET PREP, GENITAL - Abnormal; Notable for the following components:      Result Value   WBC, Wet Prep HPF POC FEW (*)    All other components within normal limits  CBC WITH DIFFERENTIAL/PLATELET - Abnormal; Notable for the following components:   RBC 3.42 (*)    Hemoglobin 11.2 (*)    HCT 34.8 (*)    MCV 101.8 (*)    All other components within normal limits  BASIC METABOLIC PANEL - Abnormal; Notable for the following components:   Potassium 3.3 (*)    All other components within normal limits  URINALYSIS, ROUTINE W REFLEX MICROSCOPIC - Abnormal; Notable for the following components:   APPearance HAZY (*)    Hgb urine dipstick LARGE (*)    RBC / HPF >50 (*)    Bacteria, UA RARE (*)    All other components within normal limits  I-STAT BETA HCG BLOOD, ED ([facility], [facility], [facility] ONLY)  GC/CHLAMYDIA PROBE AMP ([place]) NOT AT [facility]    EKG None  Radiology No results found.  Procedures Procedures (including critical care time)  Medications Ordered in ED Medications  ibuprofen (ADVIL) tablet 800 mg (800 mg Oral Given 01/04/19 1206)     Initial Impression / Assessment and Plan / ED Course  I have reviewed the triage vital signs and the nursing notes.  Pertinent labs & imaging results that were available during my care of the patient were reviewed by me and considered in my medical decision making (see chart for details).    Physical examination reveals well-appearing  23 year old female in no acute distress.  Having a soft nontender and without peritoneal signs, heart regular rate and rhythm without murmur, lungs clear to auscultation bilaterally.  Pelvic examination chaperoned by Deirdre nurse tech, bleeding present without discharge, cervical motion tenderness, adnexal tenderness or uterine tenderness  CBC with hemoglobin of 11.2 appears baseline BMP nonacute Beta-hCG negative Urinalysis with large hemoglobin, greater than 50 red blood cells, 6-10 white blood cells, patient without urinary symptoms do not suspect infection at this time Wet prep with few WBCs, negative clue, negative trichomoniasis, negative yeast GC chlamydia probe obtained and pending  As patient was recently treated for gonorrhea chlamydia 4 days ago and has not been sexually active do  not feel retreatment with azithromycin Rocephin is indicated, she is without fever or chills.  No symptoms suggestive of symptomatic anemia and with a stable hemoglobin no indication for further work-up in this nonpregnant female at this time.  Patient will be given referral to OB/GYN for further investigation into her abnormal uterine bleeding.  Patient informed that GC chlamydia pending, x2 weeks results available on MyChart in 3 days, abstain from sexual activity plan sure partners tested and treated prior to returning to sexual activity, advised safe sex practices.  At this time there does not appear to be any evidence of an acute emergency medical condition and the patient appears stable for discharge with appropriate outpatient follow up. Diagnosis was discussed with patient who verbalizes understanding of care plan and is agreeable to discharge. I have discussed return precautions with patient who verbalizes understanding of return precautions. Patient encouraged to follow-up with their PCP and OBGYN. All questions answered.  Patient's case discussed with Dr. Freida BusmanAllen who agrees with plan to discharge with  follow-up.   Note: Portions of this report may have been transcribed using voice recognition software. Every effort was made to ensure accuracy; however, inadvertent computerized transcription errors may still be present. Final Clinical Impressions(s) / ED Diagnoses   Final diagnoses:  Abnormal uterine bleeding (AUB)    ED Discharge Orders    None       Elizabeth PalauMorelli, Almarie Kurdziel A, PA-C 01/04/19 1324    Lorre NickAllen, Anthony, MD 01/05/19 [phone]

## 2019-01-04 NOTE — Telephone Encounter (Signed)
The patient is crying on the phone stating she is in pain, tired, dizzy, experiencing headaches and has also been bleeding for 1 month. The patient is scheduled for an appointment however due to the long period of bleeding I also wanted to inform the clinical team. I informed the patient of expecting a call form the clinical team.

## 2019-01-04 NOTE — Discharge Instructions (Addendum)
You have been diagnosed today with abnormal uterine bleeding.  At this time there does not appear to be the presence of an emergent medical condition, however there is always the potential for conditions to change. Please read and follow the below instructions.  Please return to the Emergency Department immediately for any new or worsening symptoms. Please be sure to follow up with your Primary Care Provider within one week regarding your visit today; please call their office to schedule an appointment even if you are feeling better for a follow-up visit. Call the women's clinic today to schedule a follow-up appointment and to establish an OB/GYN.  Your gonorrhea and Chlamydia testing results will be available in approximately 3 days. You may check your MyChart account for results. Please inform all sexual partners of positive results and that they should be tested and treated as well.Please wait 2 weeks and be sure that you and your partners are symptom free before returning to sexual activity. Please use protection with every sexual encounter.  Follow Up: Please followup with your primary doctor in 3 days for discussion of your diagnoses and further evaluation after today's visit; if you do not have a primary care doctor use the resource guide provided to find one; Please return to the ER for worsening symptoms, high fevers or persistent vomiting.  Get help right away if: You pass out. You have to change pads every hour. You have belly (abdominal) pain. You have a fever. You get sweaty. You get weak. You passing large blood clots from your vagina. Any new/concerning or worsening symptoms   Please read the additional information packets attached to your discharge summary.

## 2019-01-05 ENCOUNTER — Encounter (HOSPITAL_COMMUNITY): Payer: Self-pay | Admitting: Emergency Medicine

## 2019-01-05 ENCOUNTER — Emergency Department (HOSPITAL_COMMUNITY): Payer: Medicaid Other

## 2019-01-05 ENCOUNTER — Emergency Department (HOSPITAL_COMMUNITY)
Admission: EM | Admit: 2019-01-05 | Discharge: 2019-01-05 | Disposition: A | Discharge status: Home / Self Care | Payer: Medicaid Other | Attending: Emergency Medicine | Admitting: Emergency Medicine

## 2019-01-05 ENCOUNTER — Other Ambulatory Visit: Payer: Self-pay

## 2019-01-05 DIAGNOSIS — N939 Abnormal uterine and vaginal bleeding, unspecified: Secondary | ICD-10-CM | POA: Insufficient documentation

## 2019-01-05 DIAGNOSIS — E079 Disorder of thyroid, unspecified: Secondary | ICD-10-CM | POA: Insufficient documentation

## 2019-01-05 MED ORDER — FAMOTIDINE 20 MG PO TABS: 20.0000 mg | ORAL_TABLET | Freq: Two times a day (BID) | ORAL | 0 refills | Status: AC

## 2019-01-05 MED ORDER — MELOXICAM 15 MG PO TABS: 15.0000 mg | ORAL_TABLET | Freq: Every day | ORAL | 0 refills | Status: AC

## 2019-01-05 MED ORDER — FAMOTIDINE 20 MG PO TABS
20.0000 mg | ORAL_TABLET | Freq: Once | ORAL | Status: AC
  Administered 2019-01-05: 20 mg via ORAL
  Filled 2019-01-05: qty 1

## 2019-01-05 NOTE — Discharge Instructions (Signed)
Your ultrasound today was reassuring with no signs of abnormal masses or abnormalities to the ovaries.  Continue taking your thyroid medication.  You can take Mobic which is an anti-inflammatory as needed for pain.  Make sure you take this medication with food.  Do not take ibuprofen, Aleve, Motrin, or Advil while taking this medication.  You can take 500 to 1000 mg of Tylenol every 6 hours as needed for pain additionally if the Mobic wears off.  Do not take more than 4000 mg of Tylenol daily.  You can also take Pepcid twice daily with meals which can help with upset stomach issues.  I have attached resources for follow-up with various women's health services.  Please call to make an appointment as soon as possible for reevaluation.  Return to the emergency department if any concerning signs or symptoms develop such as severe abdominal pain, persistent vomiting, lightheadedness, or passing out.

## 2019-01-05 NOTE — ED Triage Notes (Signed)
Pt states she has been having vaginal bleeding for over 1 month has been seen at ED for same but unable to make appt with GYN.

## 2019-01-05 NOTE — ED Provider Notes (Signed)
[facility]   CSN: [id number] Arrival date & time: 01/05/19  1827    History   Chief Complaint Chief Complaint  Patient presents with   Vaginal Bleeding    HPI Danielle FrohlichCurtisha [facility] is a 23 y.o. female with history of thyroid disease returns today to the ED for evaluation of ongoing vaginal bleeding for 1 month.  She has been seen and evaluated for this multiple times in the last month or so between urgent care and the emergency department.  She was last seen for this yesterday.  She reports that she developed worsening vaginal bleeding in the last several days.  She reports that she is saturating 5-7 pads daily, similar to her heaviest days when she is menstruating usually.  This is not worsening in the last several days and has not worsened since she was seen yesterday.  When she was initially seen by urgent care her TSH was noted to be elevated and the provider recommended increasing her Synthroid.  She reports she has been compliant with this new dosage.  She notes she has been experiencing intermittent lower abdominal cramping but more recently has been experiencing some intermittent upper abdominal discomfort which she describes as mild.  She has been taking ibuprofen twice daily for the last several weeks.  She denies diarrhea, constipation, urinary symptoms, melena, hematochezia, nausea, vomiting, fevers, chills, chest pain, or shortness of breath.  She had a reassuring work-up yesterday but returns today stating that her aunt recommended presentation to the ED "so they can figure out exactly what is wrong".  She reports that she does have an appointment scheduled to see an OB/GYN in July.  She is a current smoker.     The history is provided by the patient.    Past Medical History:  Diagnosis Date   Thyroid disease     There are no active problems to display for this patient.   No past surgical history on file.   OB History     No obstetric history on file.      Home Medications    Prior to Admission medications   Medication Sig Start Date End Date Taking? Authorizing Provider  famotidine (PEPCID) 20 MG tablet Take 1 tablet (20 mg total) by mouth 2 (two) times daily. 01/05/19   Jeanie SewerFawze, Ever Gustafson A, PA-C  levothyroxine (SYNTHROID) 175 MCG tablet Take 1 tablet (175 mcg total) by mouth daily before breakfast. 12/31/18   Mardella LaymanHagler, Brian, MD  meloxicam (MOBIC) 15 MG tablet Take 1 tablet (15 mg total) by mouth daily. 01/05/19   Luevenia MaxinFawze, Geonna Lockyer A, PA-C  venlafaxine (EFFEXOR) 75 MG tablet Take 75 mg by mouth 2 (two) times daily.    [provider]    Family History Family History  Problem Relation Age of Onset   Cancer Mother     Social History Social History   Tobacco Use   Smoking status: Never Smoker   Smokeless tobacco: Never Used  Substance Use Topics   Alcohol use: No   Drug use: Yes    Types: Marijuana     Allergies   Patient has no known allergies.   Review of Systems Review of Systems  Constitutional: Negative for chills and fever.  Respiratory: Negative for shortness of breath.   Cardiovascular: Negative for chest pain.  Gastrointestinal: Positive for abdominal pain. Negative for constipation, diarrhea, nausea and vomiting.  Genitourinary: Positive for vaginal bleeding. Negative for decreased urine volume, dysuria, frequency, vaginal discharge and  vaginal pain.  All other systems reviewed and are negative.    Physical Exam Updated Vital Signs BP 108/69    Pulse (!) 58    Temp 98.1 F (36.7 C) (Oral)    Resp 16    LMP 12/05/2018    SpO2 100%   Physical Exam Vitals signs and nursing note reviewed.  Constitutional:      General: She is not in acute distress.    Appearance: She is well-developed.  HENT:     Head: Normocephalic and atraumatic.  Eyes:     General:        Right eye: No discharge.        Left eye: No discharge.     Conjunctiva/sclera: Conjunctivae normal.  Neck:      Vascular: No JVD.     Trachea: No tracheal deviation.  Cardiovascular:     Rate and Rhythm: Normal rate and regular rhythm.  Pulmonary:     Effort: Pulmonary effort is normal.     Breath sounds: Normal breath sounds.  Abdominal:     General: Abdomen is flat. Bowel sounds are normal. There is no distension.     Palpations: Abdomen is soft.     Tenderness: There is no abdominal tenderness. There is no right CVA tenderness, left CVA tenderness, guarding or rebound.  Skin:    General: Skin is warm and dry.     Findings: No erythema.  Neurological:     Mental Status: She is alert.  Psychiatric:        Behavior: Behavior normal.      ED Treatments / Results  Labs (all labs ordered are listed, but only abnormal results are displayed) Labs Reviewed - No data to display  EKG None  Radiology [place] Transvaginal Non-ob  Result Date: 01/05/2019 CLINICAL DATA:  Vaginal bleeding x1 month. EXAM: TRANSABDOMINAL AND TRANSVAGINAL ULTRASOUND OF PELVIS DOPPLER ULTRASOUND OF OVARIES TECHNIQUE: Both transabdominal and transvaginal ultrasound examinations of the pelvis were performed. Transabdominal technique was performed for global imaging of the pelvis including uterus, ovaries, adnexal regions, and pelvic cul-de-sac. It was necessary to proceed with endovaginal exam following the transabdominal exam to visualize the right ovary. Color and duplex Doppler ultrasound was utilized to evaluate blood flow to the ovaries. COMPARISON:  None. FINDINGS: Uterus Measurements: 6.3 x 3.1 x 3.6 cm = volume: 35.6 mL. No fibroids or other mass visualized. Endometrium Thickness: 0.4 cm.  No focal abnormality visualized. Right ovary Measurements: 3 x 2 x 3.1 cm = volume: 9.6 mL. Normal appearance/no adnexal mass. Left ovary Measurements: 3.8 x 2.2 x 2 cm = volume: 8.2 mL. Normal appearance/no adnexal mass. Pulsed Doppler evaluation of both ovaries demonstrates normal low-resistance arterial and venous waveforms. Other  findings There is a trace amount of free fluid in the pelvis. IMPRESSION: 1. No acute sonographic abnormality detected. 2. The endometrial stripe measures approximately 4 mm in thickness. 3. Trace amount of free fluid in the pelvis, likely physiologic. Electronically Signed   By: Katherine Mantlehristopher  Green M.D.   On: 01/05/2019 21:58   [place] Pelvis Complete  Result Date: 01/05/2019 CLINICAL DATA:  Vaginal bleeding x1 month. EXAM: TRANSABDOMINAL AND TRANSVAGINAL ULTRASOUND OF PELVIS DOPPLER ULTRASOUND OF OVARIES TECHNIQUE: Both transabdominal and transvaginal ultrasound examinations of the pelvis were performed. Transabdominal technique was performed for global imaging of the pelvis including uterus, ovaries, adnexal regions, and pelvic cul-de-sac. It was necessary to proceed with endovaginal exam following the transabdominal exam to visualize the right ovary. Color and duplex Doppler  ultrasound was utilized to evaluate blood flow to the ovaries. COMPARISON:  None. FINDINGS: Uterus Measurements: 6.3 x 3.1 x 3.6 cm = volume: 35.6 mL. No fibroids or other mass visualized. Endometrium Thickness: 0.4 cm.  No focal abnormality visualized. Right ovary Measurements: 3 x 2 x 3.1 cm = volume: 9.6 mL. Normal appearance/no adnexal mass. Left ovary Measurements: 3.8 x 2.2 x 2 cm = volume: 8.2 mL. Normal appearance/no adnexal mass. Pulsed Doppler evaluation of both ovaries demonstrates normal low-resistance arterial and venous waveforms. Other findings There is a trace amount of free fluid in the pelvis. IMPRESSION: 1. No acute sonographic abnormality detected. 2. The endometrial stripe measures approximately 4 mm in thickness. 3. Trace amount of free fluid in the pelvis, likely physiologic. Electronically Signed   By: Katherine Mantlehristopher  Green M.D.   On: 01/05/2019 21:58   [place] Art/ven Flow Abd Pelv Doppler  Result Date: 01/05/2019 CLINICAL DATA:  Vaginal bleeding x1 month. EXAM: TRANSABDOMINAL AND TRANSVAGINAL ULTRASOUND OF PELVIS DOPPLER  ULTRASOUND OF OVARIES TECHNIQUE: Both transabdominal and transvaginal ultrasound examinations of the pelvis were performed. Transabdominal technique was performed for global imaging of the pelvis including uterus, ovaries, adnexal regions, and pelvic cul-de-sac. It was necessary to proceed with endovaginal exam following the transabdominal exam to visualize the right ovary. Color and duplex Doppler ultrasound was utilized to evaluate blood flow to the ovaries. COMPARISON:  None. FINDINGS: Uterus Measurements: 6.3 x 3.1 x 3.6 cm = volume: 35.6 mL. No fibroids or other mass visualized. Endometrium Thickness: 0.4 cm.  No focal abnormality visualized. Right ovary Measurements: 3 x 2 x 3.1 cm = volume: 9.6 mL. Normal appearance/no adnexal mass. Left ovary Measurements: 3.8 x 2.2 x 2 cm = volume: 8.2 mL. Normal appearance/no adnexal mass. Pulsed Doppler evaluation of both ovaries demonstrates normal low-resistance arterial and venous waveforms. Other findings There is a trace amount of free fluid in the pelvis. IMPRESSION: 1. No acute sonographic abnormality detected. 2. The endometrial stripe measures approximately 4 mm in thickness. 3. Trace amount of free fluid in the pelvis, likely physiologic. Electronically Signed   By: Katherine Mantlehristopher  Green M.D.   On: 01/05/2019 21:58    Procedures Procedures (including critical care time)  Medications Ordered in ED Medications  famotidine (PEPCID) tablet 20 mg (20 mg Oral Given 01/05/19 2058)     Initial Impression / Assessment and Plan / ED Course  I have reviewed the triage vital signs and the nursing notes.  Pertinent labs & imaging results that were available during my care of the patient were reviewed by me and considered in my medical decision making (see chart for details).        Patient presenting for evaluation of ongoing vaginal bleeding.  She is afebrile, vital signs are stable.  She is nontoxic in appearance.  She has been seen and evaluated for this  multiple times between the emergency department and urgent care in the last month, most recently was seen in the emergency department yesterday.  She returns today because she would like further work-up as she set up an appointment to see an OB/GYN but it is not until next month.  She remains hemodynamically stable and I do not feel she requires any additional blood work as she just had some yesterday which shows no change in her H&H and her bleeding has not worsened since then.  Her abdomen is soft and nontender, no peritoneal signs and I doubt acute surgical abdominal pathology.  I suspect her upper abdominal discomfort could  be secondary to some GI upset from taking ibuprofen daily.  I will prescribe Pepcid and Mobic and we discussed bland diet and the importance of taking NSAIDs with food.  Pelvic ultrasound shows no acute sonographic abnormalities, endometrial stripe measuring approximately 4 mm in thickness and has trace amount of free fluid in her pelvis which is likely physiologic in nature.  No evidence of ovarian torsion, ectopic pregnancy, or TOA.  On reevaluation patient resting comfortably no apparent distress.  Given she is a smoker, I do not feel comfortable starting her on OCPs from the emergency department due to concern for adverse effects including blood clots.  Discharge to continue taking her thyroid medication and to attempt to move her visit up.  Discussed strict ED return precautions. Patient verbalized understanding of and agreement with plan and is safe for discharge home at this time.   Final Clinical Impressions(s) / ED Diagnoses   Final diagnoses:  Abnormal uterine bleeding (AUB)    ED Discharge Orders         Ordered    famotidine (PEPCID) 20 MG tablet  2 times daily     01/05/19 2026    meloxicam (MOBIC) 15 MG tablet  Daily     01/05/19 2026           Debroah Baller 01/06/19 0004    Davonna Belling, MD 01/06/19 2156

## 2019-01-06 LAB — GC/CHLAMYDIA PROBE AMP (~~LOC~~) NOT AT ARMC
Chlamydia: NEGATIVE
Neisseria Gonorrhea: NEGATIVE

## 2019-01-06 NOTE — Telephone Encounter (Signed)
Called pt in order to discuss her concerns and problems as stated during recent call to our office. VM left stating that she may call office back to see if a sooner appointment is available. Also she may reach out to her PCP if she continues to have problems. Lastly, if she experiences the same sx as stated in her previous phone call she should go to [facility] ED for evaluation.

## 2019-02-09 ENCOUNTER — Telehealth: Payer: Self-pay | Admitting: Student

## 2019-02-09 NOTE — Telephone Encounter (Signed)
Called the patient to complete the pre-screen. Left a detailed voicemail of wearing a face mask, sanitizing hands at the sanitizing station upon entering our office, and no visitors or children are allowed due to the COVID19 restrictions. Also informed the patient if she is experiencing any flu like symptoms please call our office to reschedule. °

## 2019-02-10 ENCOUNTER — Other Ambulatory Visit (HOSPITAL_COMMUNITY)
Admission: RE | Admit: 2019-02-10 | Discharge: 2019-02-10 | Disposition: A | Discharge status: Home / Self Care | Payer: Medicaid Other | Source: Ambulatory Visit | Attending: Obstetrics & Gynecology | Admitting: Obstetrics & Gynecology

## 2019-02-10 ENCOUNTER — Other Ambulatory Visit: Payer: Self-pay

## 2019-02-10 ENCOUNTER — Encounter: Payer: Self-pay | Admitting: Obstetrics & Gynecology

## 2019-02-10 ENCOUNTER — Ambulatory Visit (INDEPENDENT_AMBULATORY_CARE_PROVIDER_SITE_OTHER): Payer: Self-pay | Admitting: Obstetrics & Gynecology

## 2019-02-10 VITALS — BP 120/73 | HR 72 | Wt 147.0 lb

## 2019-02-10 DIAGNOSIS — Z0000 Encounter for general adult medical examination without abnormal findings: Secondary | ICD-10-CM | POA: Diagnosis present

## 2019-02-10 DIAGNOSIS — N938 Other specified abnormal uterine and vaginal bleeding: Secondary | ICD-10-CM

## 2019-02-10 DIAGNOSIS — E031 Congenital hypothyroidism without goiter: Secondary | ICD-10-CM

## 2019-02-10 MED ORDER — NORGESTREL-ETHINYL ESTRADIOL 0.3-30 MG-MCG PO TABS: 1.0000 | ORAL_TABLET | Freq: Every day | ORAL | 11 refills | Status: AC

## 2019-02-10 NOTE — Progress Notes (Signed)
   Subjective:    Patient ID: [facility], female    DOB: [birth year], 23 y.o.   MRN: [id number]  HPI 23 yo single P0 here today with the issue of DUB. She has bled for almost every day last month. She went to the ER and urgent care several times and her TSH was found to be 81. She has an endocrinologist, Dr. Posey Pronto and will see him in September. She increased her synthoid dose about a month ago. She stopped bleeding about 7 days ago. She had GC and CT 9/19 and had negative tests x 2 6/20. U/S was normal. She did not use contraception for almost a year and did not conceive.   Review of Systems Works at [employer] and is at [employer] Currently not in a relationship  She is not sure if she had Gardasil.    Objective:   Physical Exam Breathing, conversing, and ambulating normally Well nourished, well hydrated Black female, no apparent distress Abd- benign Cervix- appears normal normal size and shape, anteverted, mobile, non-tender, normal adnexal exam     Assessment & Plan:  hypothryoid with elevated TSH, 1 month change in synthroid- check TFTs Preventative care- pap smear today, discussed Gardasil DUB- probably due to abnormal thryoid But will go ahead and prescribe lo ovral

## 2019-02-10 NOTE — Progress Notes (Signed)
Stopped bleeding over a month ago still has sharp abdominal pains daily. 5 days ago period ended again

## 2019-02-11 LAB — T4, FREE: Free T4: 1.89 ng/dL — ABNORMAL HIGH (ref 0.82–1.77)

## 2019-02-11 LAB — T3, FREE: T3, Free: 2.9 pg/mL (ref 2.0–4.4)

## 2019-02-11 LAB — TSH: TSH: 3.35 u[IU]/mL (ref 0.450–4.500)

## 2019-02-12 LAB — CYTOLOGY - PAP

## 2019-04-12 ENCOUNTER — Inpatient Hospital Stay
Admit: 2019-04-12 | Discharge: 2019-04-12 | Disposition: A | Discharge status: Home / Self Care | Payer: PRIVATE HEALTH INSURANCE | Attending: Emergency Medicine

## 2019-04-12 ENCOUNTER — Telehealth: Payer: Self-pay | Admitting: Obstetrics & Gynecology

## 2019-04-12 DIAGNOSIS — N390 Urinary tract infection, site not specified: Secondary | ICD-10-CM

## 2019-04-12 LAB — URINALYSIS W/ REFLEX CULTURE
Bilirubin, Urine: NEGATIVE
Bilirubin: NEGATIVE
Glucose, Ur: NEGATIVE mg/dL
Glucose: NEGATIVE mg/dL
Ketone: NEGATIVE mg/dL
Ketones, Urine: NEGATIVE mg/dL
Nitrite, Urine: POSITIVE — AB
Nitrites: POSITIVE — AB
Protein, UA: 30 mg/dL — AB
Protein: 30 mg/dL — AB
Specific Gravity, UA: 1.015 (ref 1.003–1.030)
Specific gravity: 1.015 (ref 1.003–1.030)
Urobilinogen, UA, POCT: 0.2 EU/dL (ref 0.2–1.0)
Urobilinogen: 0.2 EU/dL (ref 0.2–1.0)
pH (UA): 6.5 (ref 5.0–8.0)
pH, UA: 6.5 (ref 5.0–8.0)

## 2019-04-12 LAB — HCG URINE, QL. - POC
HCG, Pregnancy, Urine, POC: NEGATIVE
Pregnancy test,urine (POC): NEGATIVE

## 2019-04-12 MED ORDER — PHENAZOPYRIDINE 200 MG TAB
200 mg | ORAL_TABLET | Freq: Three times a day (TID) | ORAL | 0 refills | Status: AC
Start: 2019-04-12 — End: 2019-04-14

## 2019-04-12 MED ORDER — CEPHALEXIN 500 MG CAP
500 mg | ORAL_CAPSULE | Freq: Three times a day (TID) | ORAL | 0 refills | Status: AC
Start: 2019-04-12 — End: 2019-04-22

## 2019-04-12 NOTE — ED Provider Notes (Signed)
EMERGENCY DEPARTMENT HISTORY AND PHYSICAL EXAM      Date: 04/12/2019  Patient Name: Rachel Mcfarland    History of Presenting Illness     Chief Complaint   Patient presents with   ??? Dysuria       History Provided By: Patient    HPI: [place], 23 y.o. female with PMHx as noted below presents the emergency department complaints of frequency, dysuria and lower back pain.  Patient notes that she has had some dysuria and urinary frequency for 1 week now consistent with symptoms from prior urinary tract infections.  Patient notes that 2 days ago developed some mild intermittent left lower back pain she describes as a dull ache that is constant with movement.  The pain does not radiate.  Patient otherwise is having no fevers, chills or other systemic symptoms at this time.  Denies any abnormal vaginal discharge or anterior abdominal pain, vomiting, diarrhea.    PCP: None    Current Outpatient Medications   Medication Sig Dispense Refill   ??? cephALEXin (Keflex) 500 mg capsule Take 1 Cap by mouth three (3) times daily for 10 days. 30 Cap 0   ??? phenazopyridine (Pyridium) 200 mg tablet Take 1 Tab by mouth three (3) times daily for 6 doses. 6 Tab 0   ??? levothyroxine (SYNTHROID) 125 mcg tablet Take 1 Tab by mouth Daily (before breakfast). 30 Tab 0       Past History     Past Medical History:  History reviewed. No pertinent past medical history.    Past Surgical History:  History reviewed. No pertinent surgical history.    Family History:  History reviewed. No pertinent family history.    Social History:  Social History     Tobacco Use   ??? Smoking status: Current Every Day Smoker   ??? Smokeless tobacco: Never Used   Substance Use Topics   ??? Alcohol use: Yes   ??? Drug use: Not Currently       Allergies:  No Known Allergies      Review of Systems   Review of Systems  Constitutional: Negative for fever, chills, and fatigue.   HENT: Negative for congestion, sore throat, rhinorrhea, sneezing and neck stiffness    Eyes: Negative for discharge and redness.   Respiratory: Negative for  shortness of breath, wheezing   Cardiovascular: Negative for chest pain, palpitations   Gastrointestinal: Negative for nausea, vomiting, abdominal pain, constipation, diarrhea and blood in stool.   Genitourinary: Positive for dysuria.  Negative hematuria, decreased urine volume, discharge,   Musculoskeletal: Negative for myalgias or joint pain .   Skin: Negative for rash or lesions .   Neurological: Negative weakness, light-headedness, numbness and headaches.       Physical Exam   Physical Exam    GENERAL: alert and oriented, no acute distress  EYES: PEERL, No injection, discharge or icterus.  ENT: Mucous membranes pink and moist.  NECK: Supple  LUNGS: Airway patent. Non-labored respirations. Breath sounds clear with good air entry bilaterally.  HEART: Regular rate and rhythm. No peripheral edema  ABDOMEN: Non-distended and non-tender, without guarding or rebound.  There is no CVA tenderness  SKIN:  warm, dry  MSK/EXTREMITIES: Without swelling, tenderness or deformity, symmetric with normal ROM  NEUROLOGICAL: Alert, oriented      Diagnostic Study Results     Labs -     Recent Results (from the past 12 hour(s))   URINALYSIS W/ REFLEX CULTURE    Collection Time: 04/12/19  4:37 PM    Specimen: Urine   Result Value Ref Range    Color YELLOW/STRAW      Appearance CLOUDY (A) CLEAR      Specific gravity 1.015 1.003 - 1.030      pH (UA) 6.5 5.0 - 8.0      Protein 30 (A) NEG mg/dL    Glucose Negative NEG mg/dL    Ketone Negative NEG mg/dL    Bilirubin Negative NEG      Blood SMALL (A) NEG      Urobilinogen 0.2 0.2 - 1.0 EU/dL    Nitrites Positive (A) NEG      Leukocyte Esterase LARGE (A) NEG      WBC 50-100 0 - 4 /hpf    RBC 5-10 0 - 5 /hpf    Epithelial cells MODERATE (A) FEW /lpf    Bacteria 4+ (A) NEG /hpf    UA:UC IF INDICATED URINE CULTURE ORDERED (A) CNI     HCG URINE, QL. - POC    Collection Time: 04/12/19  4:41 PM   Result Value Ref Range     Pregnancy test,urine (POC) Negative NEG         Radiologic Studies -   No orders to display     CT Results  (Last 48 hours)    None        CXR Results  (Last 48 hours)    None            Medical Decision Making   I, Augustin Schooling, MD am the first provider for this patient and am the attending of record for this patient encounter.    I reviewed the vital signs, available nursing notes, past medical history, past surgical history, family history and social history.    Vital Signs-Reviewed the patient's vital signs.  Patient Vitals for the past 12 hrs:   Temp Pulse Resp BP SpO2   04/12/19 1618 98.7 ??F (37.1 ??C) 60 16 125/73 100 %           Records Reviewed: Nursing Notes and Old Medical Records    Provider Notes (Medical Decision Making):   On presentation, the patient is well appearing, in no acute distress with normal vital signs.  Based on my history and exam the differential diagnosis for this patient includes cystitis, pyelonephritis, obstructed ureteral stone, musculoskeletal pain, PID, TOA, ovarian torsion.  Urinalysis is consistent with a urinary tract infection.  She is having no systemic symptoms at this time or any reason to suspect any clinical instability.  She is having some mild left-sided back pain which could be indicative of an early pyelonephritis so we will start her on a prolonged course of Keflex.  I provided with specific return precautions but otherwise felt stable for outpatient management.    ED Course:   Initial assessment performed. The patients presenting problems have been discussed, and they are in agreement with the care plan formulated and outlined with them.  I have encouraged them to ask questions as they arise throughout their visit.         PROGRESS  Kerrilyn Notarianni's  results have been reviewed with her.  She has been counseled regarding her diagnosis.  She verbally conveys understanding and agreement of the signs, symptoms, diagnosis, treatment and prognosis and  additionally agrees to follow up as recommended.   She also agrees with the care-plan and conveys that all of her questions have been answered.  I have also put together some discharge  instructions for her that include: 1) educational information regarding their diagnosis, 2) how to care for their diagnosis at home, as well a 3) list of reasons why they would want to return to the ED prior to their follow-up appointment, should their condition change.        Disposition:  home    PLAN:  1.   Current Discharge Medication List      START taking these medications    Details   cephALEXin (Keflex) 500 mg capsule Take 1 Cap by mouth three (3) times daily for 10 days.  Qty: 30 Cap, Refills: 0      phenazopyridine (Pyridium) 200 mg tablet Take 1 Tab by mouth three (3) times daily for 6 doses.  Qty: 6 Tab, Refills: 0           2.   Follow-up Information     Follow up With Specialties Details Why Contact Info    [facility] EMERGENCY DEPT Emergency Medicine  If symptoms worsen [address]  [place] [address]  [phone]        Return to ED if worse     Diagnosis     Clinical Impression:   1. Urinary tract infection without hematuria, site unspecified        Please note that this dictation was completed with Dragon, Advertising account planner.  Quite often unanticipated grammatical, syntax, homophones, and other interpretive errors are inadvertently transcribed by the computer software.  Please disregard these errors.  Additionally, please excuse any errors that have escaped final proofreading.

## 2019-04-12 NOTE — Progress Notes (Signed)
Susceptible, no further action needed.

## 2019-04-12 NOTE — ED Triage Notes (Signed)
CC dysuria x one week. Concerned for UTI.

## 2019-04-12 NOTE — ED Notes (Signed)
Pt given printed discharge instructions and 2 script(s).  Pt verbalized understanding of instructions and script(s).  Pt verbalized importance of following up with PCP.  Pt alert and oriented, in no acute distress, ambulatory with self.

## 2019-04-12 NOTE — Progress Notes (Signed)
Keflex given

## 2019-04-12 NOTE — ED Notes (Signed)
Emergency Department Nursing Plan of Care       The Nursing Plan of Care is developed from the Nursing assessment and Emergency Department Attending provider initial evaluation.  The plan of care may be reviewed in the ???ED Provider note???.    The Plan of Care was developed with the following considerations:   Patient / Family readiness to learn indicated by:verbalized understanding  Persons(s) to be included in education: patient  Barriers to Learning/Limitations:No    Signed     Shelley E Johnson, RN    04/12/2019   6:04 PM

## 2019-04-12 NOTE — ED Provider Notes (Signed)
ED Provider Notes by Darlyne Russian, MD at 04/12/19 1816                Author: Darlyne Russian, MD  Service: Emergency Medicine  Author Type: Physician       Filed: 04/12/19 1818  Date of Service: 04/12/19 1816  Status: Signed          Editor: Darlyne Russian, MD (Physician)               EMERGENCY DEPARTMENT HISTORY AND PHYSICAL EXAM           Date: 04/12/2019   Patient Name: Rachel Mcfarland        History of Presenting Illness          Chief Complaint       Patient presents with        ?  Dysuria           History Provided By: Patient      HPI: [place],  23 y.o. female with PMHx as noted below presents the emergency department complaints of  frequency, dysuria and lower back pain.  Patient notes that she has had some dysuria and urinary frequency for 1 week now consistent with symptoms from prior urinary tract infections.  Patient notes that 2 days ago developed some mild intermittent left  lower back pain she describes as a dull ache that is constant with movement.  The pain does not radiate.  Patient otherwise is having no fevers, chills or other systemic symptoms at this time.  Denies any abnormal vaginal discharge or anterior abdominal  pain, vomiting, diarrhea.      PCP: None        Current Outpatient Medications          Medication  Sig  Dispense  Refill           ?  cephALEXin (Keflex) 500 mg capsule  Take 1 Cap by mouth three (3) times daily for 10 days.  30 Cap  0     ?  phenazopyridine (Pyridium) 200 mg tablet  Take 1 Tab by mouth three (3) times daily for 6 doses.  6 Tab  0           ?  levothyroxine (SYNTHROID) 125 mcg tablet  Take 1 Tab by mouth Daily (before breakfast).  30 Tab  0             Past History        Past Medical History:   History reviewed. No pertinent past medical history.      Past Surgical History:   History reviewed. No pertinent surgical history.      Family History:   History reviewed. No pertinent family history.      Social History:     Social History           Tobacco Use         ?  Smoking status:  Current Every Day Smoker     ?  Smokeless tobacco:  Never Used       Substance Use Topics         ?  Alcohol use:  Yes         ?  Drug use:  Not Currently           Allergies:   No Known Allergies           Review of Systems     Review of Systems   Constitutional: Negative  for fever, chills, and fatigue.    HENT: Negative for congestion, sore throat, rhinorrhea, sneezing and neck stiffness    Eyes: Negative for discharge and redness.    Respiratory: Negative for  shortness of breath, wheezing    Cardiovascular: Negative for chest pain, palpitations    Gastrointestinal: Negative for nausea, vomiting, abdominal pain, constipation, diarrhea and blood in stool.    Genitourinary: Positive for dysuria.  Negative hematuria, decreased urine volume, discharge,    Musculoskeletal: Negative for myalgias or joint pain .    Skin: Negative for rash or lesions .    Neurological: Negative weakness, light-headedness, numbness and headaches.            Physical Exam     Physical Exam      GENERAL: alert and oriented, no acute distress   EYES: PEERL, No injection, discharge or icterus.   ENT: Mucous membranes pink and moist.   NECK: Supple   LUNGS: Airway patent. Non-labored respirations. Breath sounds clear with good air entry bilaterally.   HEART: Regular rate and rhythm. No peripheral edema   ABDOMEN: Non-distended and non-tender, without guarding or rebound.  There is no CVA tenderness   SKIN:  warm, dry   MSK/EXTREMITIES: Without swelling, tenderness or deformity, symmetric with normal ROM   NEUROLOGICAL: Alert, oriented           Diagnostic Study Results        Labs -         Recent Results (from the past 12 hour(s))     URINALYSIS W/ REFLEX CULTURE          Collection Time: 04/12/19  4:37 PM       Specimen: Urine         Result  Value  Ref Range            Color  YELLOW/STRAW          Appearance  CLOUDY (A)  CLEAR         Specific gravity  1.015  1.003 - 1.030         pH (UA)  6.5   5.0 - 8.0         Protein  30 (A)  NEG mg/dL       Glucose  Negative  NEG mg/dL       Ketone  Negative  NEG mg/dL       Bilirubin  Negative  NEG         Blood  SMALL (A)  NEG         Urobilinogen  0.2  0.2 - 1.0 EU/dL       Nitrites  Positive (A)  NEG         Leukocyte Esterase  LARGE (A)  NEG         WBC  50-100  0 - 4 /hpf       RBC  5-10  0 - 5 /hpf       Epithelial cells  MODERATE (A)  FEW /lpf       Bacteria  4+ (A)  NEG /hpf       UA:UC IF INDICATED  URINE CULTURE ORDERED (A)  CNI         HCG URINE, QL. - POC          Collection Time: 04/12/19  4:41 PM         Result  Value  Ref Range  Pregnancy test,urine (POC)  Negative  NEG             Radiologic Studies -      No orders to display          CT Results  (Last 48 hours)          None                 CXR Results  (Last 48 hours)          None                       Medical Decision Making     I, Darlyne Russian, MD am the first provider for this patient and am the attending of record for this patient encounter.      I reviewed the vital signs, available nursing notes, past medical history, past surgical history, family history and social history.      Vital Signs-Reviewed the patient's vital signs.   Patient Vitals for the past 12 hrs:            Temp  Pulse  Resp  BP  SpO2            04/12/19 1618  98.7 ??F (37.1 ??C)  60  16  125/73  100 %                 Records Reviewed: Nursing Notes and Old Medical Records      Provider Notes (Medical Decision Making):    On presentation, the patient is well appearing, in no acute distress with normal vital signs.  Based on my history and exam the differential diagnosis for this patient includes cystitis, pyelonephritis,  obstructed ureteral stone, musculoskeletal pain, PID, TOA, ovarian torsion.  Urinalysis is consistent with a urinary tract infection.  She is having no systemic symptoms at this time or any reason to suspect any clinical instability.  She is having some  mild left-sided back pain which could be  indicative of an early pyelonephritis so we will start her on a prolonged course of Keflex.  I provided with specific return precautions but otherwise felt stable for outpatient management.      ED Course:    Initial assessment performed. The patients presenting problems have been discussed, and they are in agreement with the care plan formulated and outlined with them.  I have encouraged them to ask questions as they arise throughout their visit.             PROGRESS   Shalaina Rorke's  results have been reviewed with her .  She has been counseled regarding her  diagnosis.  She verbally conveys understanding and agreement of the signs, symptoms, diagnosis, treatment and prognosis and additionally agrees  to follow up as recommended.   She also agrees with the care-plan and conveys that all of  her questions have been answered.  I have also put together some discharge instructions for her  that include: 1) educational information regarding their diagnosis, 2) how to care for their diagnosis at home, as well a 3) list of reasons why they would want to return to the ED prior to their follow-up appointment, should their condition change.            Disposition:   home      PLAN:   1.      Current Discharge Medication List  START taking these medications          Details        cephALEXin (Keflex) 500 mg capsule  Take 1 Cap by mouth three (3) times daily for 10 days.   Qty: 30 Cap, Refills:  0               phenazopyridine (Pyridium) 200 mg tablet  Take 1 Tab by mouth three (3) times daily for 6 doses.   Qty: 6 Tab, Refills:  0                      2.      Follow-up Information               Follow up With  Specialties  Details  Why  Contact Info              [facility] EMERGENCY DEPT  Emergency Medicine    If symptoms worsen  [address]   [place] [id number]   [phone]             Return to ED if worse         Diagnosis        Clinical Impression:       1.  Urinary tract infection without hematuria,  site unspecified            Please note that this dictation was completed with Dragon, Advertising account plannercomputer voice recognition software.  Quite often unanticipated grammatical, syntax, homophones, and other interpretive  errors are inadvertently transcribed by the computer software.  Please disregard these errors.  Additionally, please excuse any errors that have escaped final proofreading.

## 2019-04-12 NOTE — ED Notes (Signed)
 Emergency Department Nursing Plan of Care       The Nursing Plan of Care is developed from the Nursing assessment and Emergency Department Attending provider initial evaluation.  The plan of care may be reviewed in the "ED Provider note".    The Plan of Care was developed with the following considerations:   Patient / Family readiness to learn indicated ab:czmajopszi understanding  Persons(s) to be included in education: patient  Barriers to Learning/Limitations:No    Signed     Ginny FORBES Louder, RN    04/12/2019   6:04 PM

## 2019-04-12 NOTE — ED Notes (Signed)
CC dysuria x one week. Concerned for UTI.

## 2019-04-12 NOTE — Telephone Encounter (Signed)
Attempted to call patient with her f/u appointment w/ [facility]. No answer, left voicemail with appointment information (10/22 @ 11:15- Mychart). Patient instructed to give the office a call with concerns if needing to reschedule. Reminder mailed.

## 2019-04-14 LAB — CULTURE, URINE
Colonies Counted: >100000
Colony Count: >100000

## 2019-05-13 ENCOUNTER — Telehealth: Payer: Medicaid Other | Admitting: Obstetrics & Gynecology

## 2019-05-13 ENCOUNTER — Other Ambulatory Visit: Payer: Self-pay

## 2019-05-13 NOTE — Progress Notes (Signed)
Attempted to contact pt twice to check in for virtual appt. 2nd call at 1120. Left a VM stating purpose of call and instructing pt to call front office to reschedule.   Apolonio Schneiders RN 05/13/19

## 2019-05-13 NOTE — Progress Notes (Signed)
dnka

## 2019-05-22 ENCOUNTER — Encounter

## 2019-11-26 IMAGING — US ARTERIAL AND VENOUS ULTRASOUND OF THE ABDOMEN PELVIS AND SCROTUM
1 series · 13 of 25 positions shown · non-contrast
Comparison: None.

CLINICAL DATA: Vaginal bleeding x1 month.

EXAM:
TRANSABDOMINAL AND TRANSVAGINAL ULTRASOUND OF PELVIS
DOPPLER ULTRASOUND OF OVARIES
TECHNIQUE: Both transabdominal and transvaginal ultrasound examinations of the
pelvis were performed. Transabdominal technique was performed for
global imaging of the pelvis including uterus, ovaries, adnexal
regions, and pelvic cul-de-sac.
It was necessary to proceed with endovaginal exam following the
transabdominal exam to visualize the right ovary. Color and duplex
Doppler ultrasound was utilized to evaluate blood flow to the
ovaries.

[Series 1: arterial and venous ultrasound of the abdomen pelv · 83 acquisitions, 13 frames shown]
[im 1/83]
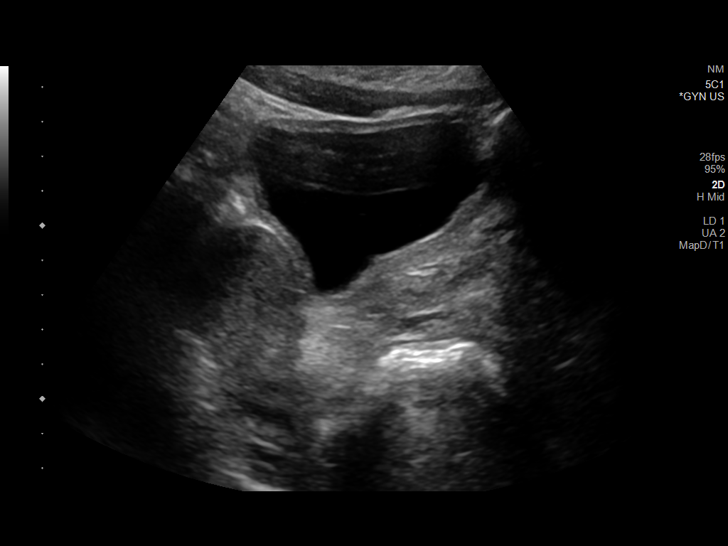
[im 7/83]
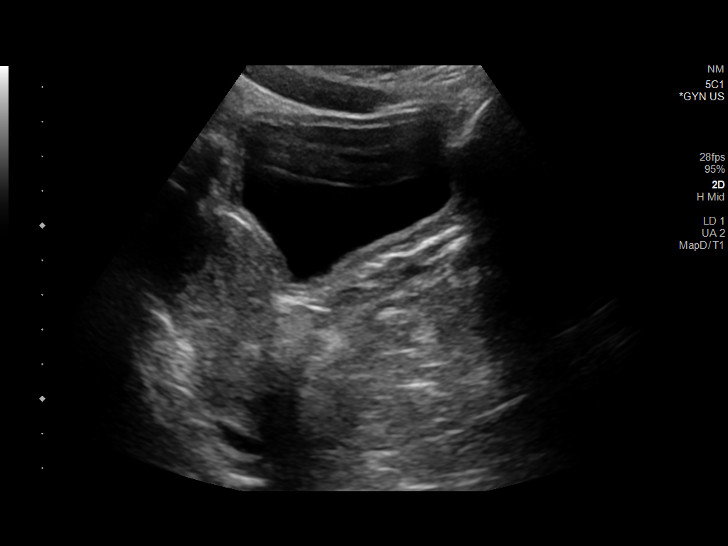
[im 14/83]
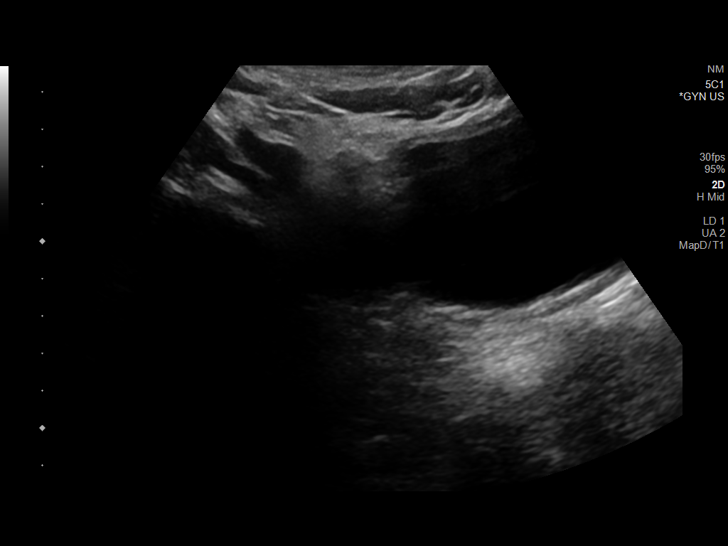
[im 21/83]
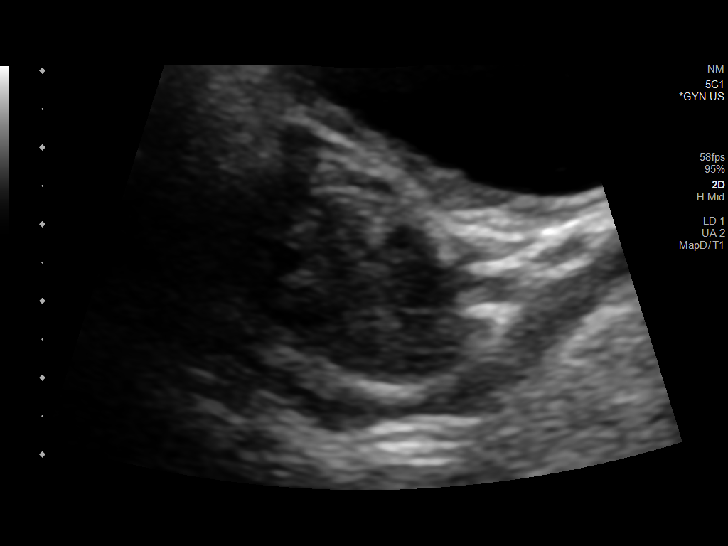
[im 28/83]
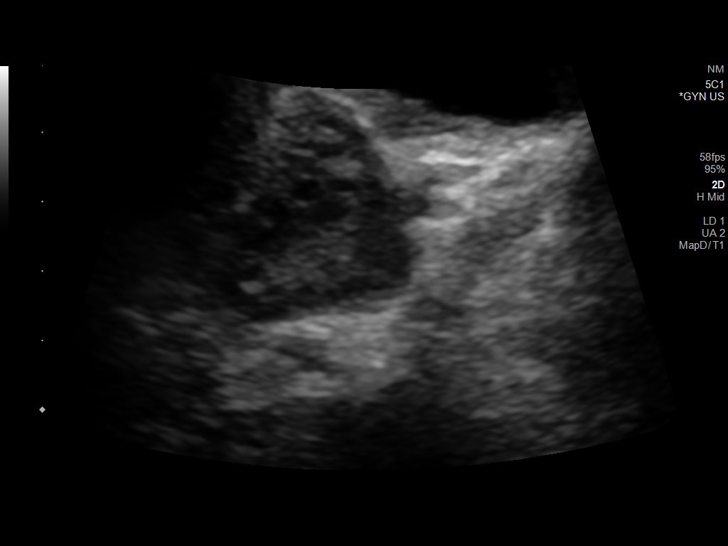
[im 35/83]
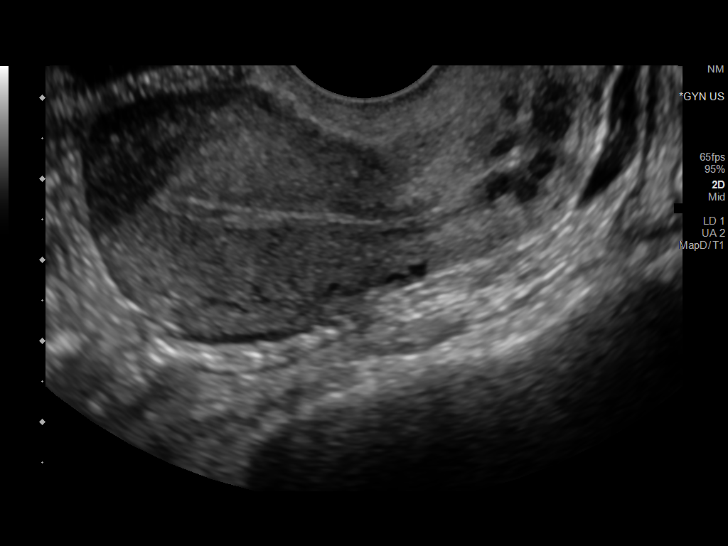
[im 42/83]
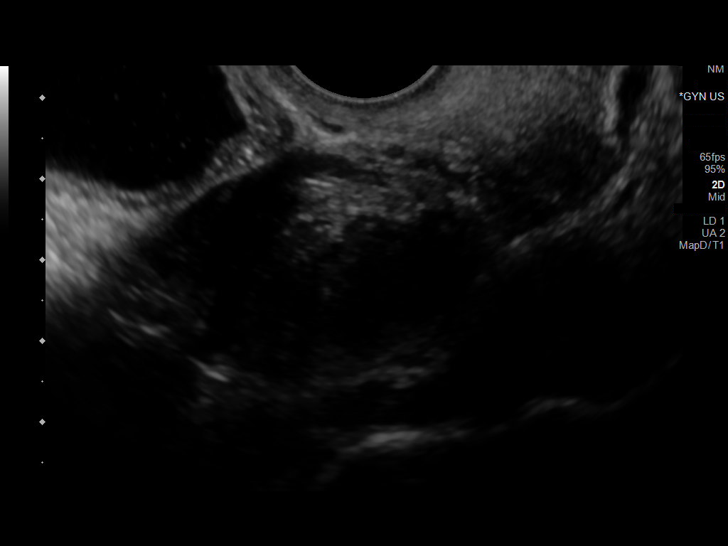
[im 48/83]
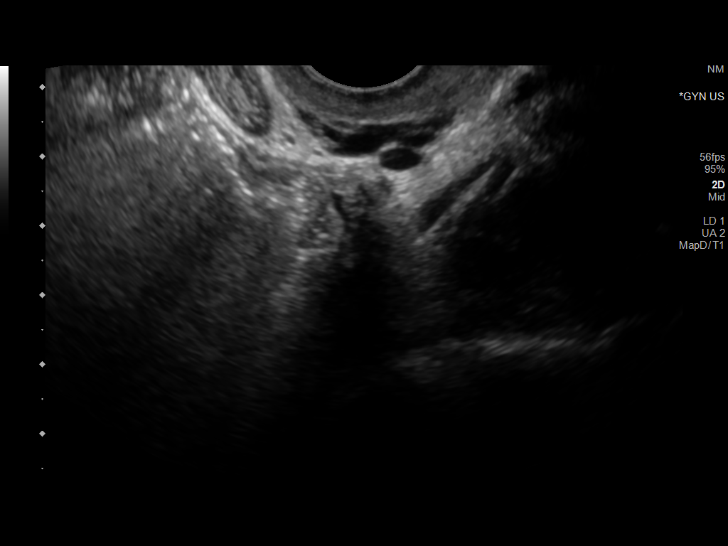
[im 55/83]
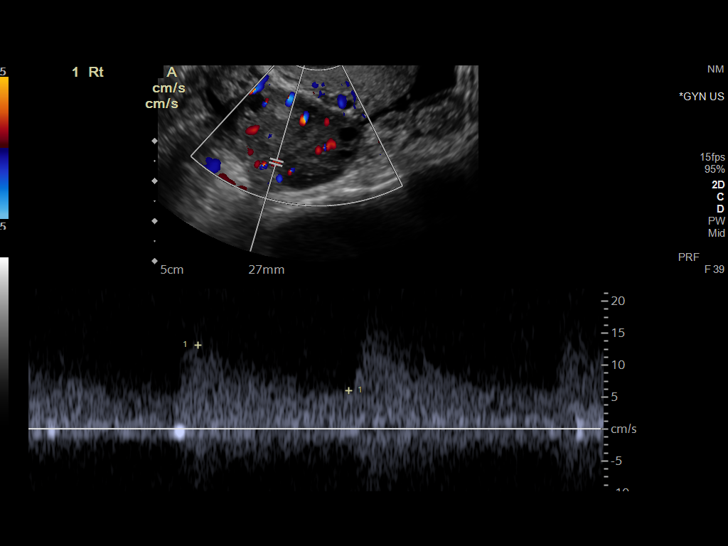
[im 62/83]
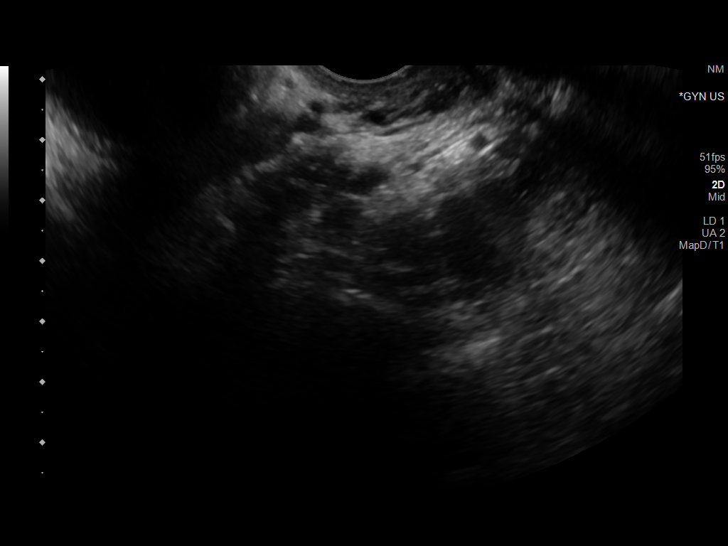
[im 69/83]
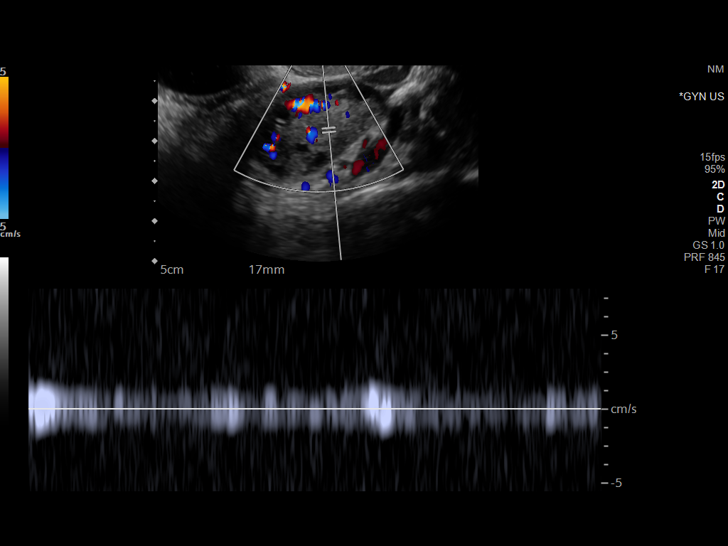
[im 76/83]
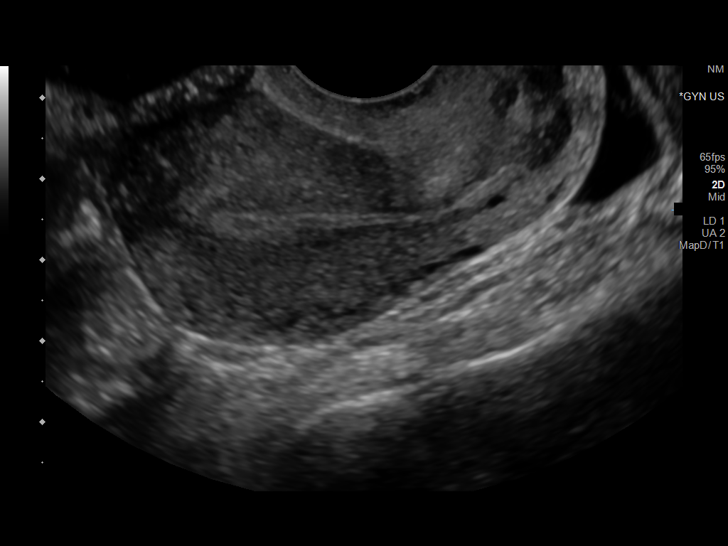
[im 83/83]
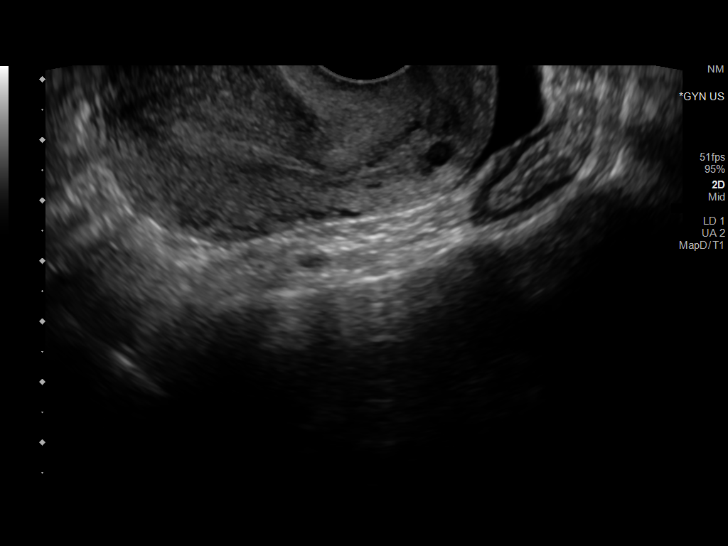

[13 of 25 positions shown; findings below may reference images not displayed]

FINDINGS: Uterus

Measurements: 6.3 x 3.1 x 3.6 cm = volume: 35.6 mL. No fibroids or
other mass visualized.

Endometrium

Thickness: 0.4 cm.  No focal abnormality visualized.

Right ovary

Measurements: 3 x 2 x 3.1 cm = volume: 9.6 mL. Normal appearance/no
adnexal mass.

Left ovary

Measurements: 3.8 x 2.2 x 2 cm = volume: 8.2 mL. Normal
appearance/no adnexal mass.

Pulsed Doppler evaluation of both ovaries demonstrates normal
low-resistance arterial and venous waveforms.

Other findings

There is a trace amount of free fluid in the pelvis.
IMPRESSION: 1. No acute sonographic abnormality detected.
2. The endometrial stripe measures approximately 4 mm in thickness.
3. Trace amount of free fluid in the pelvis, likely physiologic.
